# Patient Record
Sex: Female | Born: 1990 | Race: White | Hispanic: No | Marital: Single | Smoking: Never smoker
Health system: Southern US, Community
[De-identification: ages and names within clinical notes are randomized; demographics above are authoritative.]

## PROBLEM LIST (undated history)

## (undated) DIAGNOSIS — I10 Essential (primary) hypertension: Secondary | ICD-10-CM

## (undated) DIAGNOSIS — G43909 Migraine, unspecified, not intractable, without status migrainosus: Secondary | ICD-10-CM

## (undated) DIAGNOSIS — D649 Anemia, unspecified: Secondary | ICD-10-CM

## (undated) DIAGNOSIS — J45909 Unspecified asthma, uncomplicated: Secondary | ICD-10-CM

## (undated) DIAGNOSIS — K219 Gastro-esophageal reflux disease without esophagitis: Secondary | ICD-10-CM

## (undated) DIAGNOSIS — K589 Irritable bowel syndrome without diarrhea: Secondary | ICD-10-CM

## (undated) DIAGNOSIS — R51 Headache: Secondary | ICD-10-CM

## (undated) HISTORY — DX: Gastro-esophageal reflux disease without esophagitis: K21.9

## (undated) HISTORY — DX: Unspecified asthma, uncomplicated: J45.909

## (undated) HISTORY — DX: Essential (primary) hypertension: I10

## (undated) HISTORY — DX: Headache: R51

## (undated) HISTORY — DX: Migraine, unspecified, not intractable, without status migrainosus: G43.909

## (undated) HISTORY — DX: Irritable bowel syndrome without diarrhea: K58.9

## (undated) HISTORY — DX: Anemia, unspecified: D64.9

---
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
## 2015-05-23 ENCOUNTER — Encounter: Payer: Self-pay | Admitting: Physician Assistant

## 2015-05-23 ENCOUNTER — Ambulatory Visit (INDEPENDENT_AMBULATORY_CARE_PROVIDER_SITE_OTHER): Payer: 59 | Admitting: Physician Assistant

## 2015-05-23 VITALS — BP 100/78 | HR 85 | Temp 98.5°F | Resp 16 | Ht 64.5 in | Wt 126.0 lb

## 2015-05-23 DIAGNOSIS — K589 Irritable bowel syndrome without diarrhea: Secondary | ICD-10-CM | POA: Insufficient documentation

## 2015-05-23 DIAGNOSIS — J4520 Mild intermittent asthma, uncomplicated: Secondary | ICD-10-CM

## 2015-05-23 DIAGNOSIS — R300 Dysuria: Secondary | ICD-10-CM

## 2015-05-23 DIAGNOSIS — Z13220 Encounter for screening for lipoid disorders: Secondary | ICD-10-CM

## 2015-05-23 DIAGNOSIS — Z0000 Encounter for general adult medical examination without abnormal findings: Secondary | ICD-10-CM

## 2015-05-23 DIAGNOSIS — J45909 Unspecified asthma, uncomplicated: Secondary | ICD-10-CM | POA: Insufficient documentation

## 2015-05-23 DIAGNOSIS — R51 Headache: Secondary | ICD-10-CM

## 2015-05-23 DIAGNOSIS — G43909 Migraine, unspecified, not intractable, without status migrainosus: Secondary | ICD-10-CM

## 2015-05-23 DIAGNOSIS — Z124 Encounter for screening for malignant neoplasm of cervix: Secondary | ICD-10-CM | POA: Diagnosis not present

## 2015-05-23 DIAGNOSIS — G43119 Migraine with aura, intractable, without status migrainosus: Secondary | ICD-10-CM

## 2015-05-23 DIAGNOSIS — Z136 Encounter for screening for cardiovascular disorders: Secondary | ICD-10-CM

## 2015-05-23 DIAGNOSIS — R519 Headache, unspecified: Secondary | ICD-10-CM | POA: Insufficient documentation

## 2015-05-23 DIAGNOSIS — J3089 Other allergic rhinitis: Secondary | ICD-10-CM

## 2015-05-23 HISTORY — DX: Irritable bowel syndrome, unspecified: K58.9

## 2015-05-23 HISTORY — DX: Headache, unspecified: R51.9

## 2015-05-23 HISTORY — DX: Unspecified asthma, uncomplicated: J45.909

## 2015-05-23 HISTORY — DX: Migraine, unspecified, not intractable, without status migrainosus: G43.909

## 2015-05-23 LAB — POCT URINALYSIS DIPSTICK
BILIRUBIN UA: NEGATIVE
Blood, UA: NEGATIVE
GLUCOSE UA: NEGATIVE
Ketones, UA: NEGATIVE
LEUKOCYTES UA: NEGATIVE
NITRITE UA: NEGATIVE
Protein, UA: NEGATIVE
Spec Grav, UA: 1.01
UROBILINOGEN UA: 0.2
pH, UA: 6.5

## 2015-05-23 MED ORDER — DICYCLOMINE HCL 20 MG PO TABS: 20.0000 mg | ORAL_TABLET | Freq: Three times a day (TID) | ORAL | Status: AC | PRN

## 2015-05-23 MED ORDER — SUMATRIPTAN-NAPROXEN SODIUM 85-500 MG PO TABS: 1.0000 | ORAL_TABLET | ORAL | Status: DC | PRN

## 2015-05-23 MED ORDER — TOPIRAMATE 100 MG PO TABS: 100.0000 mg | ORAL_TABLET | Freq: Two times a day (BID) | ORAL | Status: DC

## 2015-05-23 MED ORDER — MONTELUKAST SODIUM 10 MG PO TABS: 10.0000 mg | ORAL_TABLET | Freq: Every day | ORAL | Status: AC

## 2015-05-23 MED ORDER — FLUTICASONE PROPIONATE 50 MCG/ACT NA SUSP: 2.0000 | Freq: Every day | NASAL | Status: AC

## 2015-05-23 MED ORDER — TIZANIDINE HCL 4 MG PO TABS: 4.0000 mg | ORAL_TABLET | Freq: Once | ORAL | Status: AC

## 2015-05-23 MED ORDER — SUMATRIPTAN SUCCINATE 6 MG/0.5ML ~~LOC~~ SOLN: 6.0000 mg | SUBCUTANEOUS | Status: AC | PRN

## 2015-05-23 NOTE — Progress Notes (Signed)
Patient: Kristen Cervantes, Female    DOB: 07/27/1990, 24 y.o.   MRN: [id number] Visit Date: 05/23/2015  Today's Provider: Margaretann Loveless, PA-C   Chief Complaint  Patient presents with  . Establish Care   Subjective:    Annual physical exam Kristen Cervantes is a 24 y.o. female who presents today to establish Care and for health maintenance and complete physical. She feels well. She reports exercising, runs and lift weights once a week. She reports she is sleeping well. Previous PCP was Dr. Ulice [facility]. Patient had her Influenza Vaccine at [facility]. Patient has concerns about having difficult urination. 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 She states that summer of 2015 she developed a UTI that progressed to pyelonephritis for which she had to go to the emergency room for. She states that since then she has had difficulty urinating and recurrent UTIs. She has never had a Pap smear , mammogram or colonoscopy. -----------------------------------------------------------------   Review of Systems  Constitutional: Negative.   Eyes: Negative.   Respiratory: Negative.   Cardiovascular: Negative.   Gastrointestinal: Negative.   Endocrine: Negative.   Genitourinary: Positive for difficulty urinating.  Musculoskeletal: Positive for neck pain.  Skin: Negative.   Allergic/Immunologic: Positive for environmental allergies.  Neurological: Positive for headaches (migraines).  Hematological: Negative.   Psychiatric/Behavioral: Negative.   All other systems reviewed and are negative.   Social History      She  reports that she has never smoked. She does not have any smokeless tobacco history on file. She reports that she drinks alcohol. She reports that she does not use illicit drugs.       Social History   Social History  . Marital Status: Single    Spouse Name: N/A  . Number of Children: N/A    . Years of Education: N/A   Social History Main Topics  . Smoking status: Never Smoker   . Smokeless tobacco: None  . Alcohol Use: Yes     Comment: 3x a week  . Drug Use: No  . Sexual Activity: Not Asked   Other Topics Concern  . None   Social History Narrative  . None    Patient Active Problem List   Diagnosis Date Noted  . Frequent headaches 05/23/2015  . Asthma 05/23/2015  . IBS (irritable bowel syndrome) 05/23/2015    Past Surgical History  Procedure Laterality Date  . Appendectomy  97  . Robotically assisted laparoscopic ureteral re-implantation  45    Family History        Family Status  Relation Status Death Age  . Mother Alive   . Father Alive         Her family history includes Cancer in her mother.    Allergies  Allergen Reactions  . Other     Cats: Asthma, watery eyes, runny nose    Previous Medications   ALBUTEROL (PROVENTIL IN)    Inhale into the lungs as needed.   CETIRIZINE (ZYRTEC) 10 MG TABLET    Take 10 mg by mouth daily.   DICYCLOMINE (BENTYL) 20 MG TABLET    Take 20 mg by mouth 3 (three) times daily as needed for spasms.   FLUTICASONE (FLONASE) 50 MCG/ACT NASAL SPRAY    Place into both nostrils daily.   MONTELUKAST (SINGULAIR) 10 MG TABLET    Take 10 mg by  mouth daily.   SUMATRIPTAN (IMITREX) 6 MG/0.5ML SOLN INJECTION    Inject 6 mg into the skin as needed for migraine or headache. May repeat in 2 hours if headache persists or recurs.   SUMATRIPTAN-NAPROXEN SODIUM (TREXIMET PO)    Take by mouth as needed.   TIZANIDINE (ZANAFLEX) 4 MG TABLET    Take 4 mg by mouth once.   TOPIRAMATE (TOPAMAX) 100 MG TABLET    Take 100 mg by mouth 2 (two) times daily.    Patient Care Team: Margaretann Loveless, PA-C as PCP - General (Family Medicine)     Objective:   Vitals: BP 100/78 mmHg  Pulse 85  Temp(Src) 98.5 F (36.9 C) (Oral)  Resp 16  Ht 5' 4.5" (1.638 m)  Wt 126 lb (57.153 kg)  BMI 21.30 kg/m2   Physical Exam  Constitutional: She is  oriented to person, place, and time. She appears well-developed and well-nourished. No distress.  HENT:  Head: Normocephalic and atraumatic.  Right Ear: Hearing, tympanic membrane, external ear and ear canal normal.  Left Ear: Hearing, tympanic membrane, external ear and ear canal normal.  Nose: Nose normal.  Mouth/Throat: Uvula is midline, oropharynx is clear and moist and mucous membranes are normal. No oropharyngeal exudate.  Eyes: Conjunctivae and EOM are normal. Pupils are equal, round, and reactive to light. Right eye exhibits no discharge. Left eye exhibits no discharge. No scleral icterus.  Neck: Normal range of motion. Neck supple. No JVD present. Carotid bruit is not present. No tracheal deviation present. No thyromegaly present.  Cardiovascular: Normal rate, regular rhythm, normal heart sounds and intact distal pulses.  Exam reveals no gallop and no friction rub.   No murmur heard. Pulmonary/Chest: Effort normal and breath sounds normal. No respiratory distress. She has no wheezes. She has no rales. She exhibits no tenderness. Right breast exhibits no inverted nipple, no mass, no nipple discharge, no skin change and no tenderness. Left breast exhibits no inverted nipple, no mass, no nipple discharge, no skin change and no tenderness. Breasts are symmetrical.  Abdominal: Soft. Bowel sounds are normal. She exhibits no distension and no mass. There is no tenderness. There is no rebound and no guarding. Hernia confirmed negative in the right inguinal area and confirmed negative in the left inguinal area.  Genitourinary: Rectum normal, vagina normal and uterus normal. No breast swelling, tenderness, discharge or bleeding. Pelvic exam was performed with patient supine. There is no rash, tenderness, lesion or injury on the right labia. There is no rash, tenderness, lesion or injury on the left labia. Cervix exhibits no motion tenderness, no discharge and no friability. Right adnexum displays no  mass, no tenderness and no fullness. Left adnexum displays no mass, no tenderness and no fullness. No erythema, tenderness or bleeding in the vagina. No signs of injury around the vagina. No vaginal discharge found.  Musculoskeletal: Normal range of motion. She exhibits no edema or tenderness.  Lymphadenopathy:    She has no cervical adenopathy.       Right: No inguinal adenopathy present.       Left: No inguinal adenopathy present.  Neurological: She is alert and oriented to person, place, and time. She has normal reflexes. No cranial nerve deficit. Coordination normal.  Skin: Skin is warm and dry. No rash noted. She is not diaphoretic.  Psychiatric: She has a normal mood and affect. Her behavior is normal. Judgment and thought content normal.  Vitals reviewed.    Depression Screen No flowsheet data found.  Assessment & Plan:     Routine Health Maintenance and Physical Exam  1. Annual physical exam Physical exam was normal today. I will check labs as below. I will follow-up with her pending her lab results. If labs are within normal limits and stable she would not need to have them repeated for one year. I will see her back in one year for her repeat annual physical exam. She is to call the office if she has any acute issues, questions or concerns in the meantime. - CBC with Differential/Platelet - Comprehensive metabolic panel - TSH  2. Cervical cancer screening  Pap was collected today. I will follow-up with her pending results of Pap smear. If Pap is within normal limits she would not need to have this repeated for 3 years. - Pap IG, CT/NG w/ reflex HPV when [facility])  3. Encounter for lipid screening for cardiovascular disease She has never had her cholesterol checked. I will check a cholesterol panel as below for screening. I will follow-up with her pending these results. If cholesterol is within normal limits and stable she will not need to have this  rechecked for 5 years. - Lipid panel  4. IBS (irritable bowel syndrome) Currently stable. Continue current medical treatment plan. Medications pulled for refill. - tiZANidine (ZANAFLEX) 4 MG tablet; Take 1 tablet (4 mg total) by mouth once.  Dispense: 30 tablet; Refill: 11 - dicyclomine (BENTYL) 20 MG tablet; Take 1 tablet (20 mg total) by mouth 3 (three) times daily as needed for spasms.  Dispense: 30 tablet; Refill: 11  5. Dysuria UA in the office today was negative for UTI. I will send for culture to make sure that it is not early onset of UTIs and she does have a history of recurrent UTIs and is somewhat symptomatic at this time. Depending on the results of the culture and sensitivities we will treat with appropriate antibiotic if positive. If culture comes back negative we may consider a referral to urology for further evaluation for the difficulty urinating. - POCT urinalysis dipstick - Urine Culture  6. Asthma, mild intermittent, uncomplicated Currently stable. She does have an albuterol inhaler that she uses as needed. She states that she may have to use her albuterol inhaler once every 2-3 months. Continue current medical treatment plan and will follow-up in one year. She is to call the office if she has any acute issues, questions or concerns.  7. Intractable migraine with aura without status migrainosus She states that previously her migraines were controlled well with Topamax. She's has been out of her Topamax for one month. She states that her headaches have become more frequent and that she has been trying to control them that that she can with over-the-counter Excedrin. This has not been working well. She also would take Treximet as needed at onset of migraine. If the Treximet did not work at onset she was to take the Imitrex following. This treatment regimen has been working for her well up until the last month when she ran out of all of her medications and could not get them  refilled. I will refill all medications as below. She is to call the office if she has any worsening symptoms. - topiramate (TOPAMAX) 100 MG tablet; Take 1 tablet (100 mg total) by mouth 2 (two) times daily.  Dispense: 60 tablet; Refill: 11 - SUMAtriptan-naproxen (TREXIMET) 85-500 MG tablet; Take 1 tablet by mouth as needed.  Dispense: 10 tablet; Refill: 6 - SUMAtriptan (IMITREX) 6  MG/0.5ML SOLN injection; Inject 0.5 mLs (6 mg total) into the skin as needed for migraine or headache. May repeat in 2 hours if headache persists or recurs.  Dispense: 10 vial; Refill: 3  8. Environmental and seasonal allergies Currently stable on Zyrtec, Singulair and Flonase. Singulair and Flonase were pulled for refill as below. I did advise her to call the office if her allergy symptoms worsen especially, March when spring allergies become more prevalent. She voiced understanding. She states that she [place] She states that they did talk to her about doing allergy injections at one time but she never went through with them. I did advise for her to let me know if her allergy symptoms become uncontrolled come spring and we can refer her to an ENT for further evaluation of her allergies and consideration for allergy injections. - montelukast (SINGULAIR) 10 MG tablet; Take 1 tablet (10 mg total) by mouth daily.  Dispense: 30 tablet; Refill: 11 - fluticasone (FLONASE) 50 MCG/ACT nasal spray; Place 2 sprays into both nostrils daily.  Dispense: 16 g; Refill: 11   Exercise Activities and Dietary recommendations Goals    None       There is no immunization history on file for this patient.  Health Maintenance  Topic Date Due  . HIV Screening  03/19/2006  . TETANUS/TDAP  03/19/2010  . PAP SMEAR  03/19/2012  . INFLUENZA VACCINE  12/31/2014      Discussed health benefits of physical activity, and encouraged her to engage in regular exercise  appropriate for her age and condition.    --------------------------------------------------------------------

## 2015-05-23 NOTE — Patient Instructions (Signed)
Health Maintenance, Female Adopting a healthy lifestyle and getting preventive care can go a long way to promote health and wellness. Talk with your health care provider about what schedule of regular examinations is right for you. This is a good chance for you to check in with your provider about disease prevention and staying healthy. In between checkups, there are plenty of things you can do on your own. Experts have done a lot of research about which lifestyle changes and preventive measures are most likely to keep you healthy. Ask your health care provider for more information. WEIGHT AND DIET  Eat a healthy diet  Be sure to include plenty of vegetables, fruits, low-fat dairy products, and lean protein.  Do not eat a lot of foods high in solid fats, added sugars, or salt.  Get regular exercise. This is one of the most important things you can do for your health.  Most adults should exercise for at least 150 minutes each week. The exercise should increase your heart rate and make you sweat (moderate-intensity exercise).  Most adults should also do strengthening exercises at least twice a week. This is in addition to the moderate-intensity exercise.  Maintain a healthy weight  Body mass index (BMI) is a measurement that can be used to identify possible weight problems. It estimates body fat based on height and weight. Your health care provider can help determine your BMI and help you achieve or maintain a healthy weight.  For females 28 years of age and older:   A BMI below 18.5 is considered underweight.  A BMI of 18.5 to 24.9 is normal.  A BMI of 25 to 29.9 is considered overweight.  A BMI of 30 and above is considered obese.  Watch levels of cholesterol and blood lipids  You should start having your blood tested for lipids and cholesterol at 24 years of age, then have this test every 5 years.  You may need to have your cholesterol levels checked more often if:  Your lipid  or cholesterol levels are high.  You are older than 24 years of age.  You are at high risk for heart disease.  CANCER SCREENING   Lung Cancer  Lung cancer screening is recommended for adults 75-66 years old who are at high risk for lung cancer because of a history of smoking.  A yearly low-dose CT scan of the lungs is recommended for people who:  Currently smoke.  Have quit within the past 15 years.  Have at least a 30-pack-year history of smoking. A pack year is smoking an average of one pack of cigarettes a day for 1 year.  Yearly screening should continue until it has been 15 years since you quit.  Yearly screening should stop if you develop a health problem that would prevent you from having lung cancer treatment.  Breast Cancer  Practice breast self-awareness. This means understanding how your breasts normally appear and feel.  It also means doing regular breast self-exams. Let your health care provider know about any changes, no matter how small.  If you are in your 20s or 30s, you should have a clinical breast exam (CBE) by a health care provider every 1-3 years as part of a regular health exam.  If you are 25 or older, have a CBE every year. Also consider having a breast X-ray (mammogram) every year.  If you have a family history of breast cancer, talk to your health care provider about genetic screening.  If you  are at high risk for breast cancer, talk to your health care provider about having an MRI and a mammogram every year.  Breast cancer gene (BRCA) assessment is recommended for women who have family members with BRCA-related cancers. BRCA-related cancers include:  Breast.  Ovarian.  Tubal.  Peritoneal cancers.  Results of the assessment will determine the need for genetic counseling and BRCA1 and BRCA2 testing. Cervical Cancer Your health care provider may recommend that you be screened regularly for cancer of the pelvic organs (ovaries, uterus, and  vagina). This screening involves a pelvic examination, including checking for microscopic changes to the surface of your cervix (Pap test). You may be encouraged to have this screening done every 3 years, beginning at age 21.  For women ages 30-65, health care providers may recommend pelvic exams and Pap testing every 3 years, or they may recommend the Pap and pelvic exam, combined with testing for human papilloma virus (HPV), every 5 years. Some types of HPV increase your risk of cervical cancer. Testing for HPV may also be done on women of any age with unclear Pap test results.  Other health care providers may not recommend any screening for nonpregnant women who are considered low risk for pelvic cancer and who do not have symptoms. Ask your health care provider if a screening pelvic exam is right for you.  If you have had past treatment for cervical cancer or a condition that could lead to cancer, you need Pap tests and screening for cancer for at least 20 years after your treatment. If Pap tests have been discontinued, your risk factors (such as having a new sexual partner) need to be reassessed to determine if screening should resume. Some women have medical problems that increase the chance of getting cervical cancer. In these cases, your health care provider may recommend more frequent screening and Pap tests. Colorectal Cancer  This type of cancer can be detected and often prevented.  Routine colorectal cancer screening usually begins at 24 years of age and continues through 24 years of age.  Your health care provider may recommend screening at an earlier age if you have risk factors for colon cancer.  Your health care provider may also recommend using home test kits to check for hidden blood in the stool.  A small camera at the end of a tube can be used to examine your colon directly (sigmoidoscopy or colonoscopy). This is done to check for the earliest forms of colorectal  cancer.  Routine screening usually begins at age 50.  Direct examination of the colon should be repeated every 5-10 years through 24 years of age. However, you may need to be screened more often if early forms of precancerous polyps or small growths are found. Skin Cancer  Check your skin from head to toe regularly.  Tell your health care provider about any new moles or changes in moles, especially if there is a change in a mole's shape or color.  Also tell your health care provider if you have a mole that is larger than the size of a pencil eraser.  Always use sunscreen. Apply sunscreen liberally and repeatedly throughout the day.  Protect yourself by wearing long sleeves, pants, a wide-brimmed hat, and sunglasses whenever you are outside. HEART DISEASE, DIABETES, AND HIGH BLOOD PRESSURE   High blood pressure causes heart disease and increases the risk of stroke. High blood pressure is more likely to develop in:  People who have blood pressure in the high end   of the normal range (130-139/85-89 mm Hg).  People who are overweight or obese.  People who are African American.  If you are 38-23 years of age, have your blood pressure checked every 3-5 years. If you are 61 years of age or older, have your blood pressure checked every year. You should have your blood pressure measured twice--once when you are at a hospital or clinic, and once when you are not at a hospital or clinic. Record the average of the two measurements. To check your blood pressure when you are not at a hospital or clinic, you can use:  An automated blood pressure machine at a pharmacy.  A home blood pressure monitor.  If you are between 45 years and 39 years old, ask your health care provider if you should take aspirin to prevent strokes.  Have regular diabetes screenings. This involves taking a blood sample to check your fasting blood sugar level.  If you are at a normal weight and have a low risk for diabetes,  have this test once every three years after 24 years of age.  If you are overweight and have a high risk for diabetes, consider being tested at a younger age or more often. PREVENTING INFECTION  Hepatitis B  If you have a higher risk for hepatitis B, you should be screened for this virus. You are considered at high risk for hepatitis B if:  You were born in a country where hepatitis B is common. Ask your health care provider which countries are considered high risk.  Your parents were born in a high-risk country, and you have not been immunized against hepatitis B (hepatitis B vaccine).  You have HIV or AIDS.  You use needles to inject street drugs.  You live with someone who has hepatitis B.  You have had sex with someone who has hepatitis B.  You get hemodialysis treatment.  You take certain medicines for conditions, including cancer, organ transplantation, and autoimmune conditions. Hepatitis C  Blood testing is recommended for:  Everyone born from [birth year] through 1965.  Anyone with known risk factors for hepatitis C. Sexually transmitted infections (STIs)  You should be screened for sexually transmitted infections (STIs) including gonorrhea and chlamydia if:  You are sexually active and are younger than 24 years of age.  You are older than 24 years of age and your health care provider tells you that you are at risk for this type of infection.  Your sexual activity has changed since you were last screened and you are at an increased risk for chlamydia or gonorrhea. Ask your health care provider if you are at risk.  If you do not have HIV, but are at risk, it may be recommended that you take a prescription medicine daily to prevent HIV infection. This is called pre-exposure prophylaxis (PrEP). You are considered at risk if:  You are sexually active and do not regularly use condoms or know the HIV status of your partner(s).  You take drugs by injection.  You are sexually  active with a partner who has HIV. Talk with your health care provider about whether you are at high risk of being infected with HIV. If you choose to begin PrEP, you should first be tested for HIV. You should then be tested every 3 months for as long as you are taking PrEP.  PREGNANCY   If you are premenopausal and you may become pregnant, ask your health care provider about preconception counseling.  If you may  become pregnant, take 400 to 800 micrograms (mcg) of folic acid every day.  If you want to prevent pregnancy, talk to your health care provider about birth control (contraception). OSTEOPOROSIS AND MENOPAUSE   Osteoporosis is a disease in which the bones lose minerals and strength with aging. This can result in serious bone fractures. Your risk for osteoporosis can be identified using a bone density scan.  If you are 61 years of age or older, or if you are at risk for osteoporosis and fractures, ask your health care provider if you should be screened.  Ask your health care provider whether you should take a calcium or vitamin D supplement to lower your risk for osteoporosis.  Menopause may have certain physical symptoms and risks.  Hormone replacement therapy may reduce some of these symptoms and risks. Talk to your health care provider about whether hormone replacement therapy is right for you.  HOME CARE INSTRUCTIONS   Schedule regular health, dental, and eye exams.  Stay current with your immunizations.   Do not use any tobacco products including cigarettes, chewing tobacco, or electronic cigarettes.  If you are pregnant, do not drink alcohol.  If you are breastfeeding, limit how much and how often you drink alcohol.  Limit alcohol intake to no more than 1 drink per day for nonpregnant women. One drink equals 12 ounces of beer, 5 ounces of wine, or 1 ounces of hard liquor.  Do not use street drugs.  Do not share needles.  Ask your health care provider for help if  you need support or information about quitting drugs.  Tell your health care provider if you often feel depressed.  Tell your health care provider if you have ever been abused or do not feel safe at home.   This information is not intended to replace advice given to you by your health care provider. Make sure you discuss any questions you have with your health care provider.   Document Released: 12/01/2010 Document Revised: 06/08/2014 Document Reviewed: 04/19/2013 Elsevier Interactive Patient Education [employer].

## 2015-05-24 ENCOUNTER — Telehealth: Payer: Self-pay

## 2015-05-24 DIAGNOSIS — R39198 Other difficulties with micturition: Secondary | ICD-10-CM

## 2015-05-24 DIAGNOSIS — N9489 Other specified conditions associated with female genital organs and menstrual cycle: Secondary | ICD-10-CM

## 2015-05-24 LAB — URINE CULTURE

## 2015-05-24 LAB — PLEASE NOTE

## 2015-05-24 NOTE — Telephone Encounter (Signed)
-----   Message from Margaretann LovelessJennifer M Burnette, [place]PA-C sent at 05/24/2015  4:43 PM EST ----- Urine culture was negative for UTI. Please see if she would like a referral to Urology at this time.

## 2015-05-24 NOTE — Telephone Encounter (Signed)
Pt advised; she would like to proceed with the referral to Urology.   Thanks,   -Vernona RiegerLaura

## 2015-05-25 LAB — CBC WITH DIFFERENTIAL/PLATELET
BASOS: 0 %
Basophils Absolute: 0 10*3/uL (ref 0.0–0.2)
EOS (ABSOLUTE): 0 10*3/uL (ref 0.0–0.4)
EOS: 1 %
HEMATOCRIT: 36.8 % (ref 34.0–46.6)
Hemoglobin: 12 g/dL (ref 11.1–15.9)
IMMATURE GRANULOCYTES: 0 %
Immature Grans (Abs): 0 10*3/uL (ref 0.0–0.1)
LYMPHS ABS: 1.7 10*3/uL (ref 0.7–3.1)
Lymphs: 39 %
MCH: 29.3 pg (ref 26.6–33.0)
MCHC: 32.6 g/dL (ref 31.5–35.7)
MCV: 90 fL (ref 79–97)
MONOS ABS: 0.4 10*3/uL (ref 0.1–0.9)
Monocytes: 8 %
NEUTROS ABS: 2.3 10*3/uL (ref 1.4–7.0)
Neutrophils: 52 %
Platelets: 182 10*3/uL (ref 150–379)
RBC: 4.09 x10E6/uL (ref 3.77–5.28)
RDW: 12.9 % (ref 12.3–15.4)
WBC: 4.5 10*3/uL (ref 3.4–10.8)

## 2015-05-25 LAB — COMPREHENSIVE METABOLIC PANEL
A/G RATIO: 2.2 (ref 1.1–2.5)
ALT: 21 IU/L (ref 0–32)
AST: 17 IU/L (ref 0–40)
Albumin: 4.6 g/dL (ref 3.5–5.5)
Alkaline Phosphatase: 54 IU/L (ref 39–117)
BILIRUBIN TOTAL: 0.3 mg/dL (ref 0.0–1.2)
BUN/Creatinine Ratio: 13 (ref 8–20)
BUN: 10 mg/dL (ref 6–20)
CALCIUM: 9.3 mg/dL (ref 8.7–10.2)
CO2: 23 mmol/L (ref 18–29)
Chloride: 100 mmol/L (ref 96–106)
Creatinine, Ser: 0.76 mg/dL (ref 0.57–1.00)
GFR calc Af Amer: 127 mL/min/{1.73_m2} (ref >59)
GFR, EST NON AFRICAN AMERICAN: 110 mL/min/{1.73_m2} (ref >59)
GLOBULIN, TOTAL: 2.1 g/dL (ref 1.5–4.5)
Glucose: 92 mg/dL (ref 65–99)
POTASSIUM: 4.5 mmol/L (ref 3.5–5.2)
SODIUM: 140 mmol/L (ref 134–144)
Total Protein: 6.7 g/dL (ref 6.0–8.5)

## 2015-05-25 LAB — LIPID PANEL
CHOL/HDL RATIO: 2.4 ratio (ref 0.0–4.4)
Cholesterol, Total: 111 mg/dL (ref 100–199)
HDL: 47 mg/dL (ref >39)
LDL Calculated: 57 mg/dL (ref 0–99)
Triglycerides: 34 mg/dL (ref 0–149)
VLDL Cholesterol Cal: 7 mg/dL (ref 5–40)

## 2015-05-25 LAB — TSH: TSH: 2.84 u[IU]/mL (ref 0.450–4.500)

## 2015-05-28 ENCOUNTER — Telehealth: Payer: Self-pay

## 2015-05-28 NOTE — Telephone Encounter (Signed)
Patient advised as directed below.  Thanks,  -Mateya Torti 

## 2015-05-28 NOTE — Telephone Encounter (Signed)
-----   Message from Margaretann LovelessJennifer M Burnette, [place] JerseyPA-C sent at 05/28/2015  8:26 AM EST ----- All labs are within normal limits and stable.  Still awaiting pap results. Thanks! -JB

## 2015-05-29 LAB — PAP IG, CT-NG, RFX HPV ASCU
CHLAMYDIA, NUC. ACID AMP: NEGATIVE
Gonococcus by Nucleic Acid Amp: NEGATIVE
PAP SMEAR COMMENT: 0

## 2015-06-12 ENCOUNTER — Telehealth: Payer: Self-pay | Admitting: Obstetrics and Gynecology

## 2015-06-12 ENCOUNTER — Encounter: Payer: Self-pay | Admitting: Obstetrics and Gynecology

## 2015-06-12 ENCOUNTER — Ambulatory Visit (INDEPENDENT_AMBULATORY_CARE_PROVIDER_SITE_OTHER): Payer: 59 | Admitting: Obstetrics and Gynecology

## 2015-06-12 VITALS — BP 117/78 | HR 88 | Ht 64.5 in | Wt 126.8 lb

## 2015-06-12 DIAGNOSIS — N390 Urinary tract infection, site not specified: Secondary | ICD-10-CM | POA: Diagnosis not present

## 2015-06-12 DIAGNOSIS — R39198 Other difficulties with micturition: Secondary | ICD-10-CM | POA: Diagnosis not present

## 2015-06-12 DIAGNOSIS — R350 Frequency of micturition: Secondary | ICD-10-CM | POA: Diagnosis not present

## 2015-06-12 DIAGNOSIS — N8184 Pelvic muscle wasting: Secondary | ICD-10-CM | POA: Diagnosis not present

## 2015-06-12 DIAGNOSIS — M6289 Other specified disorders of muscle: Secondary | ICD-10-CM

## 2015-06-12 LAB — MICROSCOPIC EXAMINATION
Bacteria, UA: NONE SEEN
Epithelial Cells (non renal): NONE SEEN /hpf (ref 0–10)

## 2015-06-12 LAB — URINALYSIS, COMPLETE
Bilirubin, UA: NEGATIVE
GLUCOSE, UA: NEGATIVE
Ketones, UA: NEGATIVE
Nitrite, UA: NEGATIVE
PROTEIN UA: NEGATIVE
SPEC GRAV UA: 1.01 (ref 1.005–1.030)
Urobilinogen, Ur: 0.2 mg/dL (ref 0.2–1.0)
pH, UA: 7 (ref 5.0–7.5)

## 2015-06-12 LAB — BLADDER SCAN AMB NON-IMAGING: Scan Result: 31

## 2015-06-12 NOTE — Telephone Encounter (Signed)
Please notify patient that I would also like for her to have a KUB done when she goes to get her RUS performed.  I would like to make sure she does not have kidney stones.  I have placed the order.  thanks

## 2015-06-12 NOTE — Progress Notes (Signed)
06/12/2015 3:24 PM   Lewie Chamber [birth year] [id number]  Referring provider: Margaretann Loveless, PA-C [address] [place], [place] [address]  Chief Complaint  Patient presents with  . Urinary Retention  . Establish Care    HPI: Patient is a 25 year old female presenting today as a referral from her primary care provider with complaints of recurrent UTIs, urinary frequency, hesitancy and intermittent stream. Daytime frequency every 1-2 hours.  Reports she's experienced many UTIs since she had a kidney infection 1 year ago. No cultures are available but per patient they were positive for infection. She denies gross hematuria, flank pain, fevers or dysuria.   History of ureteral reimplantation as an infant. 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  H20 intake 2-3 bottles per day.    Normal pelvic exam last week by PCP.   No history of kidney stones.  Mother has OAB.    PMH: Past Medical History  Diagnosis Date  . Anemia   . GERD (gastroesophageal reflux disease)   . High blood pressure     Surgical History: Past Surgical History  Procedure Laterality Date  . Appendectomy  97  . Robotically assisted laparoscopic ureteral re-implantation  93    Home Medications:    Medication List       This list is accurate as of: 06/12/15  3:24 PM.  Always use your most recent med list.               cetirizine 10 MG tablet  Commonly known as:  ZYRTEC  Take 10 mg by mouth daily.     dicyclomine 20 MG tablet  Commonly known as:  BENTYL  Take 1 tablet (20 mg total) by mouth 3 (three) times daily as needed for spasms.     fluticasone 50 MCG/ACT nasal spray  Commonly known as:  FLONASE  Place 2 sprays into both nostrils daily.     montelukast 10 MG tablet  Commonly known as:  SINGULAIR  Take 1 tablet (10 mg total) by mouth daily.     PROVENTIL IN  Inhale into the lungs as needed.     SUMAtriptan 6 MG/0.5ML Soln injection  Commonly known as:   IMITREX  Inject 0.5 mLs (6 mg total) into the skin as needed for migraine or headache. May repeat in 2 hours if headache persists or recurs.     SUMAtriptan-naproxen 85-500 MG tablet  Commonly known as:  TREXIMET  Take 1 tablet by mouth as needed.     tiZANidine 4 MG tablet  Commonly known as:  ZANAFLEX  Take 1 tablet (4 mg total) by mouth once.     topiramate 100 MG tablet  Commonly known as:  TOPAMAX  Take 1 tablet (100 mg total) by mouth 2 (two) times daily.        Allergies:  Allergies  Allergen Reactions  . Other     Cats: Asthma, watery eyes, runny nose    Family History: Family History  Problem Relation Age of Onset  . Cancer Mother     Social History:  reports that she has never smoked. She does not have any smokeless tobacco history on file. She reports that she drinks alcohol. She reports that she does not use illicit drugs.  ROS: UROLOGY Frequent Urination?: Yes Hard to postpone urination?: No Burning/pain with urination?: No Get up at night to urinate?: No Leakage of urine?: No Urine stream starts and stops?: No Trouble starting stream?: Yes Do you have  to strain to urinate?: No Blood in urine?: No Urinary tract infection?: No Sexually transmitted disease?: No Injury to kidneys or bladder?: Yes Painful intercourse?: No Weak stream?: No Currently pregnant?: No Vaginal bleeding?: No Last menstrual period?: 06/10/15  Gastrointestinal Nausea?: No Vomiting?: No Indigestion/heartburn?: No Diarrhea?: No Constipation?: No  Constitutional Fever: No Night sweats?: No Weight loss?: No Fatigue?: Yes  Skin Skin rash/lesions?: No Itching?: No  Eyes Blurred vision?: No Double vision?: No  Ears/Nose/Throat Sore throat?: Yes Sinus problems?: Yes  Hematologic/Lymphatic Swollen glands?: No Easy bruising?: No  Cardiovascular Leg swelling?: No Chest pain?: No  Respiratory Cough?: Yes Shortness of breath?: No  Endocrine Excessive thirst?:  No  Musculoskeletal Back pain?: No Joint pain?: No  Neurological Headaches?: Yes Dizziness?: No  Psychologic Depression?: No Anxiety?: No  Physical Exam: BP 117/78 mmHg  Pulse 88  Ht 5' 4.5" (1.638 m)  Wt 126 lb 12.8 oz (57.516 kg)  BMI 21.44 kg/m2  Constitutional:  Alert and oriented, No acute distress. HEENT: [place] AT, moist mucus membranes.  Trachea midline, no masses. Cardiovascular: No clubbing, cyanosis, or edema. Respiratory: Normal respiratory effort, no increased work of breathing. GI: Abdomen is soft, nontender, nondistended, no abdominal masses GU: No CVA tenderness. Skin: No rashes, bruises or suspicious lesions. Neurologic: Grossly intact, no focal deficits, moving all 4 extremities. Psychiatric: Normal mood and affect.  Laboratory Data:   Urinalysis    Component Value Date/Time   BILIRUBINUR negative 05/23/2015 1054   PROTEINUR negative 05/23/2015 1054   UROBILINOGEN 0.2 05/23/2015 1054   NITRITE negative 05/23/2015 1054   LEUKOCYTESUR Negative 05/23/2015 1054    Pertinent Imaging:   Assessment & Plan:    1.  Urinary frequency- Provided samples of uribel today. We will reevaluate her symptoms when she returns to review her renal ultrasound results. She now a KUB in the future to evaluate for possible constipation. She does take Bentyl for IBS.  2.  Recurrent UTI- History of ureteral reflux as an infant s/p ureteral reimplantation. Renal ultrasound ordered for further evaluation.  -RUS UTI prevention strategies discussed.  Good perineal hygiene reviewed. Patient is encouraged to increase daily water intake, start cranberry supplements to prevent invasive colonization along the urinary tract and probiotics, especially lactobacillus to restore normal vaginal flora.  3. Pelvic floor dysfunction- Referral placed for PT consultation. -Referral Physical therapy   There are no diagnoses linked to this encounter.  Return for RUS results.  These notes  generated with voice recognition software. I apologize for typographical errors.  Earlie LouLindsay Promyse Ardito, FNP  [facility] [address], [address] [place], [place] [address] [phone]

## 2015-06-12 NOTE — Patient Instructions (Signed)

## 2015-06-12 NOTE — Telephone Encounter (Signed)
LMOM-needs x-ray at the time of RUS.

## 2015-06-21 ENCOUNTER — Ambulatory Visit
Admission: RE | Admit: 2015-06-21 | Discharge: 2015-06-21 | Disposition: A | Discharge status: Home / Self Care | Payer: 59 | Source: Ambulatory Visit | Attending: Obstetrics and Gynecology | Admitting: Obstetrics and Gynecology

## 2015-06-21 DIAGNOSIS — R350 Frequency of micturition: Secondary | ICD-10-CM

## 2015-06-21 DIAGNOSIS — N390 Urinary tract infection, site not specified: Secondary | ICD-10-CM | POA: Insufficient documentation

## 2015-06-21 DIAGNOSIS — R39198 Other difficulties with micturition: Secondary | ICD-10-CM

## 2015-06-26 ENCOUNTER — Encounter: Payer: Self-pay | Admitting: Obstetrics and Gynecology

## 2015-06-26 ENCOUNTER — Ambulatory Visit (INDEPENDENT_AMBULATORY_CARE_PROVIDER_SITE_OTHER): Payer: 59 | Admitting: Obstetrics and Gynecology

## 2015-06-26 VITALS — BP 107/74 | HR 98 | Resp 16 | Ht 64.5 in | Wt 124.3 lb

## 2015-06-26 DIAGNOSIS — R339 Retention of urine, unspecified: Secondary | ICD-10-CM | POA: Diagnosis not present

## 2015-06-26 DIAGNOSIS — N390 Urinary tract infection, site not specified: Secondary | ICD-10-CM

## 2015-06-26 DIAGNOSIS — R350 Frequency of micturition: Secondary | ICD-10-CM | POA: Diagnosis not present

## 2015-06-26 DIAGNOSIS — N8184 Pelvic muscle wasting: Secondary | ICD-10-CM | POA: Diagnosis not present

## 2015-06-26 DIAGNOSIS — M6289 Other specified disorders of muscle: Secondary | ICD-10-CM

## 2015-06-26 LAB — URINALYSIS, COMPLETE
BILIRUBIN UA: NEGATIVE
Glucose, UA: NEGATIVE
KETONES UA: NEGATIVE
LEUKOCYTES UA: NEGATIVE
Nitrite, UA: NEGATIVE
PROTEIN UA: NEGATIVE
RBC UA: NEGATIVE
SPEC GRAV UA: 1.015 (ref 1.005–1.030)
Urobilinogen, Ur: 0.2 mg/dL (ref 0.2–1.0)
pH, UA: 7 (ref 5.0–7.5)

## 2015-06-26 LAB — MICROSCOPIC EXAMINATION
RBC, UA: NONE SEEN /hpf (ref 0–?)
WBC, UA: NONE SEEN /hpf (ref 0–?)

## 2015-06-26 NOTE — Progress Notes (Signed)
3:49 PM   Kristen Cervantes 11/14/1990 [id number]  Referring provider: Margaretann Loveless, PA-C [address] [place], [place] [address]  Chief Complaint  Patient presents with  . Results  . Urinary Frequency    HPI: Patient is a 25 year old female presenting today as a referral from her primary care provider with complaints of recurrent UTIs, urinary frequency, hesitancy and intermittent stream. Daytime frequency every 1-2 hours.  Reports she's experienced many UTIs since she had a kidney infection 1 year ago. No cultures are available but per patient they were positive for infection. She denies gross hematuria, flank pain, fevers or dysuria.   History of ureteral reimplantation as an infant. 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  H20 intake 2-3 bottles per day.    Normal pelvic exam last week by PCP.   No history of kidney stones.  Mother has OAB.    PMH: Past Medical History  Diagnosis Date  . Anemia   . GERD (gastroesophageal reflux disease)   . High blood pressure     Surgical History: Past Surgical History  Procedure Laterality Date  . Appendectomy  97  . Robotically assisted laparoscopic ureteral re-implantation  93    Home Medications:    Medication List       This list is accurate as of: 06/26/15  3:49 PM.  Always use your most recent med list.               cetirizine 10 MG tablet  Commonly known as:  ZYRTEC  Take 10 mg by mouth daily.     dicyclomine 20 MG tablet  Commonly known as:  BENTYL  Take 1 tablet (20 mg total) by mouth 3 (three) times daily as needed for spasms.     fluticasone 50 MCG/ACT nasal spray  Commonly known as:  FLONASE  Place 2 sprays into both nostrils daily.     montelukast 10 MG tablet  Commonly known as:  SINGULAIR  Take 1 tablet (10 mg total) by mouth daily.     PROVENTIL IN  Inhale into the lungs as needed.     SUMAtriptan 6 MG/0.5ML Soln injection  Commonly known as:  IMITREX  Inject 0.5  mLs (6 mg total) into the skin as needed for migraine or headache. May repeat in 2 hours if headache persists or recurs.     SUMAtriptan-naproxen 85-500 MG tablet  Commonly known as:  TREXIMET  Take 1 tablet by mouth as needed.     tiZANidine 4 MG tablet  Commonly known as:  ZANAFLEX  Take 1 tablet (4 mg total) by mouth once.     topiramate 100 MG tablet  Commonly known as:  TOPAMAX  Take 1 tablet (100 mg total) by mouth 2 (two) times daily.        Allergies:  Allergies  Allergen Reactions  . Other     Cats: Asthma, watery eyes, runny nose    Family History: Family History  Problem Relation Age of Onset  . Cancer Mother     Social History:  reports that she has never smoked. She does not have any smokeless tobacco history on file. She reports that she drinks alcohol. She reports that she does not use illicit drugs.  ROS: UROLOGY Frequent Urination?: Yes Hard to postpone urination?: No Burning/pain with urination?: No Get up at night to urinate?: No Leakage of urine?: No Urine stream starts and stops?: Yes Trouble starting stream?: Yes Do you have to strain  to urinate?: Yes Blood in urine?: No Urinary tract infection?: No Sexually transmitted disease?: No Injury to kidneys or bladder?: No Painful intercourse?: No Weak stream?: No Currently pregnant?: No Vaginal bleeding?: No Last menstrual period?: 06/10/15  Gastrointestinal Nausea?: No Vomiting?: No Indigestion/heartburn?: No Diarrhea?: No Constipation?: No  Constitutional Fever: No Night sweats?: No Weight loss?: No Fatigue?: No  Skin Skin rash/lesions?: No Itching?: No  Eyes Blurred vision?: No Double vision?: No  Ears/Nose/Throat Sore throat?: No Sinus problems?: No  Hematologic/Lymphatic Swollen glands?: No Easy bruising?: No  Cardiovascular Leg swelling?: No Chest pain?: No  Respiratory Cough?: No Shortness of breath?: No  Endocrine Excessive thirst?:  No  Musculoskeletal Back pain?: No Joint pain?: No  Neurological Headaches?: No Dizziness?: No  Psychologic Depression?: No Anxiety?: No  Physical Exam: BP 107/74 mmHg  Pulse 98  Resp 16  Ht 5' 4.5" (1.638 m)  Wt 124 lb 4.8 oz (56.382 kg)  BMI 21.01 kg/m2  LMP 06/14/2015  Constitutional:  Alert and oriented, No acute distress. HEENT: [place] AT, moist mucus membranes.  Trachea midline, no masses. Cardiovascular: No clubbing, cyanosis, or edema. Respiratory: Normal respiratory effort, no increased work of breathing. GI: Abdomen is soft, nontender, nondistended, no abdominal masses GU: No CVA tenderness. Skin: No rashes, bruises or suspicious lesions. Neurologic: Grossly intact, no focal deficits, moving all 4 extremities. Psychiatric: Normal mood and affect.  Laboratory Data:   Urinalysis Results for orders placed or performed in visit on 06/12/15  Microscopic Examination  Result Value Ref Range   WBC, UA 0-5 0 -  5 /hpf   RBC, UA 0-2 0 -  2 /hpf   Epithelial Cells (non renal) None seen 0 - 10 /hpf   Mucus, UA Present (A) Not Estab.   Bacteria, UA None seen None seen/Few  Urinalysis, Complete  Result Value Ref Range   Specific Gravity, UA 1.010 1.005 - 1.030   pH, UA 7.0 5.0 - 7.5   Color, UA Yellow Yellow   Appearance Ur Clear Clear   Leukocytes, UA Trace (A) Negative   Protein, UA Negative Negative/Trace   Glucose, UA Negative Negative   Ketones, UA Negative Negative   RBC, UA 1+ (A) Negative   Bilirubin, UA Negative Negative   Urobilinogen, Ur 0.2 0.2 - 1.0 mg/dL   Nitrite, UA Negative Negative   Microscopic Examination See below:   BLADDER SCAN AMB NON-IMAGING  Result Value Ref Range   Scan Result 31 mL      Pertinent Imaging: CLINICAL DATA: Recurrent UTI, urinary frequency  EXAM: RENAL / URINARY TRACT ULTRASOUND COMPLETE  COMPARISON: None.  FINDINGS: Right Kidney:  Length: 11 cm. Echogenicity within normal limits. No mass  or hydronephrosis visualized.  Left Kidney:  Length: 12 cm. Echogenicity within normal limits. No mass or hydronephrosis visualized. Mild left pelviectasis.  Bladder:  Appears normal for degree of bladder distention. Prevoid volume 100 mL. Postvoid volume 0 mL.  IMPRESSION: Normal renal ultrasound.   Electronically Signed  By: Elige Ko  On: 06/21/2015 16:55  CLINICAL DATA: Difficulty urinating  EXAM: ABDOMEN - 1 VIEW  COMPARISON: None.  FINDINGS: No disproportionate dilatation of bowel. No obvious free intraperitoneal gas. Mild scoliosis of the lumbar spine.  IMPRESSION: Nonobstructive bowel gas pattern. Electronically Signed  By: Jolaine Click M.D.  On: 06/21/2015 17:07  Assessment & Plan:    1.  Urinary frequency- No improvement with uribel. Patient reports that symptoms are unchanged from previous visit. KUB demonstrating no stones or evidence of constipation.  2.  Recurrent UTI- History of ureteral reflux as an infant s/p ureteral reimplantation. 06/21/15 RUS unremarkable.   3. Pelvic floor dysfunction- Referral placed for PT consultation last visit.  Patient has appointment tomorrow.  There are no diagnoses linked to this encounter.  Return for UDS results with MD.  These notes generated with voice recognition software. I apologize for typographical errors.  Earlie Lou, FNP  [facility] [address], [address] [place], [place] [address] [phone]

## 2015-06-27 ENCOUNTER — Ambulatory Visit: Payer: 59 | Attending: Obstetrics and Gynecology | Admitting: Physical Therapy

## 2015-06-27 DIAGNOSIS — R279 Unspecified lack of coordination: Secondary | ICD-10-CM | POA: Diagnosis not present

## 2015-06-27 DIAGNOSIS — R293 Abnormal posture: Secondary | ICD-10-CM | POA: Insufficient documentation

## 2015-06-27 NOTE — Patient Instructions (Signed)
No sit ups   Frog stretch 15x w/ exhale  childs poses w/ pillows 5 breaths  <-> childs pose rocking 5 x                                                   Preserve the function of your pelvic floor, abdomen, and back.              Avoid decreased straining of abdominal/pelvic floor muscles with less              slouching,  holding your breath with lifting/bowel movements)                                                     FUNCTIONAL POSTURES

## 2015-06-28 NOTE — Therapy (Signed)
[facility] MAIN [facility] SERVICES [address] [place], [place], [address] Phone: [phone]   Fax:  [phone]  Physical Therapy Evaluation  Patient Details  Name: Kristen Cervantes MRN: [id number] Date of Birth: 07-31-1990 Referring Provider: Dreama Saa   Encounter Date: 06/27/2015      PT End of Session - 06/28/15 1252    Visit Number 1   Number of Visits 12   Date for PT Re-Evaluation 09/12/15   PT Start Time 1300   PT Stop Time 1405   PT Time Calculation (min) 65 min   Activity Tolerance Patient tolerated treatment well;No increased pain   Behavior During Therapy [facility] for tasks assessed/performed      Past Medical History  Diagnosis Date  . Anemia   . GERD (gastroesophageal reflux disease)   . High blood pressure     Past Surgical History  Procedure Laterality Date  . Appendectomy  97  . Robotically assisted laparoscopic ureteral re-implantation  93    There were no vitals filed for this visit.  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  Pt finds that she has to urinate every 15 min in the evening, every 1 hr in teh morning. Pt feels  "she does not get it all out". She notices intermittent urine stream. Pt has to sit for 30 min before she feels completely emptied. 2)  Poor sleep: getting to sleep (not working days)  and staying asleep (working days) . Pt feels " she is never fully awake". Working third shift is difficult.     3) scoliosis related pain:  5/10 mid to lower area with  shoulder/ neck 7/10    Pertinent History Pt reports she has had IBS when she was 25 yo. Constipation has been a problem for most of  her life.  Pt reports  daily bowel movements now but with  Type 2 on Bristol Stool Scale. Pt has increased her fiber. Hx : Appendectomy. Scoliosis (have worn a brace brace as a teen).  1 x week fitness routine: elliptical, crunches 25x , medicine ball    Patient Stated Goals get her body working better     Currently in Pain? Yes   Pain Score --   Pain Location --            [facility] PT Assessment - 06/28/15 1226    Assessment   Medical Diagnosis difficulty w/ urination and urinary frequency   Referring Provider [facility]    Precautions   Precautions None   Restrictions   Weight Bearing Restrictions No   Balance Screen   Has the patient fallen in the past 6 months No   Observation/Other Assessments   Observations slight L lumbar scoliosis    Coordination   Gross Motor Movements are Fluid and Coordinated --  abdominal straining w/ cue for bowel movement   Fine Motor Movements are Fluid and Coordinated --  limited pelvic floor relaxation   AROM   Overall AROM Comments L rotation 50% compared to R , pain with ext on low back    PROM  Overall PROM Comments hip IR supine ~5 deg bilaterally pre-Tx, ~10 deg post-Tx   Palpation   SI assessment  symmetry noted, tenderness at PSIS , lateral edges , no tenderness with cue for exhalation, deep core engagement    Palpation comment --                 Pelvic Floor Special Questions - 06/28/15 1252    Diastasis Recti neg   External Perineal Exam through clothing                  PT Education - 06/28/15 1252    Education provided Yes   Education Details POC, HEP, goals, anatomy and physiology   Person(s) Educated Patient   Methods Explanation;Demonstration;Tactile cues;Handout;Verbal cues   Comprehension Verbalized understanding;Returned demonstration             PT Long Term Goals - 06/27/15 1423    PT LONG TERM GOAL #1   Title Pt will report decreased time from 30 min to < 15 min to completely urinate before  bed.    Time 12   Period Weeks   Status New   PT LONG TERM GOAL #2   Title Pt will report frequency of urination during the day at every 2 hrs and at night every 1 hr before bed  in order to sleep and to work.    Time 12   Period Weeks   Status New   PT LONG TERM GOAL #3   Title Pt will report decreased LBP from 6-7/10 to < 2/10 in order to improve QOL.    Time 12   Period Weeks   Status New   PT LONG TERM GOAL #4   Title Pt will report ability to insert tampons with less difficulty from 70% to < 50% to promote hygiene and tolerate gyn exams.    Time 12   Period Weeks   Status New               Plan - 06/28/15 1253    Clinical Impression Statement Pt is a 25 yo female who c/o urinary frequency and difficulty w/ urination, CLBP 2/2 scoliosis, and poor sleep  quality. These factors impact her QOL. Pt's clinical presentations include  hyperactive pelvic floor, poor coordination of deep core mm and strength,  abdominal scar, poor body mechanics that places strain on her pelvic floor  muscles, poor stress management skills,  spinal deviations, and  limited  spinal mobility. Pt's personal factors include scoliosis, IBS, and working  night shift.  With these factors combined, pt 's conditions are stable and  moderate  in complexity. Pt tolerated manual Tx today without complaints.  PT demonstrated decreased mm tensions and decreased  tenderness to  palpation on pelvic floor muscles with increased hip IR PROM. Plan to  initiate intravaginal  assessment at next session.      Pt will benefit from skilled therapeutic intervention in order to improve on the following deficits Decreased activity tolerance;Postural dysfunction;Increased muscle spasms;Improper body mechanics;Decreased strength;Pain;Hypomobility;Decreased coordination;Decreased endurance;Increased fascial restricitons   PT Frequency 1x / week   PT Duration 12 weeks   PT Treatment/Interventions ADLs/Self Care Home  Management;Aquatic Therapy;Electrical Stimulation;Moist Heat;Traction;Ultrasound;Patient/family education;Neuromuscular re-education;Therapeutic exercise;Therapeutic activities;Functional mobility training;Stair training;Manual techniques;Scar mobilization;Energy conservation;Taping  relaxation training, pain science education         Problem List Patient Active Problem List   Diagnosis Date Noted  . Frequent headaches 05/23/2015  . IBS (irritable bowel syndrome) 05/23/2015  .  Asthma 05/23/2015  . Migraines 05/23/2015    Mariane Masters ,PT, DPT, E-RYT  06/28/2015, 1:03 PM  [facility] MAIN [facility] SERVICES [address] [place], [place], [address] Phone: [phone]   Fax:  [phone]  Name: Kristen Cervantes MRN: [id number] Date of Birth: May 26, 1991

## 2015-07-04 ENCOUNTER — Ambulatory Visit: Payer: 59 | Attending: Obstetrics and Gynecology | Admitting: Physical Therapy

## 2015-07-04 DIAGNOSIS — R293 Abnormal posture: Secondary | ICD-10-CM | POA: Diagnosis not present

## 2015-07-04 DIAGNOSIS — R279 Unspecified lack of coordination: Secondary | ICD-10-CM | POA: Diagnosis not present

## 2015-07-04 NOTE — Patient Instructions (Addendum)
Emailed body scan with supporting research. To practice prior to bed.    Restorative yoga (legs propped over couch), use of blankets, rice bags over pelvis, scarf to quiet nervous system  (handout)  To practice after work to decompress.

## 2015-07-04 NOTE — Therapy (Signed)
[facility] MAIN [facility] SERVICES [address] [place], [place], [address] Phone: [phone]   Fax:  [phone]  Physical Therapy Treatment  Patient Details  Name: Kristen Cervantes MRN: [id number] Date of Birth: 09-19-1990 Referring Provider: Dreama Saa   Encounter Date: 07/04/2015      PT End of Session - 07/04/15 1133    Visit Number 2   Number of Visits 12   Date for PT Re-Evaluation 09/12/15   PT Start Time 0910   PT Stop Time 1000   PT Time Calculation (min) 50 min   Activity Tolerance Patient tolerated treatment well;No increased pain   Behavior During Therapy [facility] for tasks assessed/performed      Past Medical History  Diagnosis Date  . Anemia   . GERD (gastroesophageal reflux disease)   . High blood pressure     Past Surgical History  Procedure Laterality Date  . Appendectomy  97  . Robotically assisted laparoscopic ureteral re-implantation  93    There were no vitals filed for this visit.  Visit Diagnosis:  Posture abnormality  Lack of coordination      Subjective Assessment - 07/04/15 0912    Subjective Pt reported that elevating her feet on a stool has felt " more natural" when having bowel movements.  Pt states she has not been sleeping well due to stressful dreams. 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              [facility] PT Assessment - 07/04/15 1130    Observation/Other Assessments   Observations rounded shoulders upon arrival,  more upright posture end of session   Palpation   Palpation comment non-soft  over abdomen, (decreased tension post Tx)                      [facility] Adult PT Treatment/Exercise - 07/04/15 1129    Neuro Re-ed    Neuro Re-ed Details  guided pt through body scan, educated about principles of restroative yoga, positioned pt and used props to faciliate relaxation   Moist Heat Therapy   Number Minutes Moist Heat 10 Minutes   Moist Heat  Location Other (comment)  abdomen/ skin intact postTx   Manual Therapy   Manual therapy comments abdominal belly massage, fascial release over scar                PT Education - 07/04/15 1133    Education provided Yes   Education Details HEP   Person(s) Educated Patient   Methods Explanation;Demonstration;Tactile cues;Verbal cues;Handout   Comprehension Verbalized understanding;Returned demonstration             PT Long Term Goals - 07/04/15 1136    PT LONG TERM GOAL #1   Title Pt will report decreased time from 30 min to < 15 min to completely urinate before bed.    Time 12   Period Weeks   Status New   PT LONG TERM GOAL #2   Title Pt will report frequency of urination during the day at every 2 hrs and at night every 1 hr before bed  in order to sleep and to work.    Time 12   Period Weeks   Status New   PT LONG TERM GOAL #3   Title Pt will report decreased LBP from 6-7/10 to < 2/10 in order to improve QOL.    Time 12   Period Weeks  Status New   PT LONG TERM GOAL #4   Title Pt will report ability to insert tampons with less difficulty from 70% to < 50% to promote hygiene and tolerate gyn exams.    Time 12   Period Weeks   Status New   PT LONG TERM GOAL #5   Title Pt will be IND and compliant with relaxation techniques in order to down train her nervous system independently for better pelvic floor and GI function. .    Time 12   Period Weeks   Status New               Plan - 07/04/15 1133    Clinical Impression Statement Pt reported feeling "serene" and "more relaxed" after relaxation training and manual Tx. Addressed relaxation education to promote sleep quality , self-care, and stress management for improved bowel, urinary, GI, and pelvic floor function.   Anticipate pt will progress towards her goals with further PT.    Pt will benefit from skilled therapeutic intervention in order to improve on the following deficits Decreased activity  tolerance;Postural dysfunction;Increased muscle spasms;Improper body mechanics;Decreased strength;Pain;Hypomobility;Decreased coordination;Decreased endurance;Increased fascial restricitons   PT Frequency 1x / week   PT Duration 12 weeks   PT Treatment/Interventions ADLs/Self Care Home Management;Aquatic Therapy;Electrical Stimulation;Moist Heat;Traction;Ultrasound;Patient/family education;Neuromuscular re-education;Therapeutic exercise;Therapeutic activities;Functional mobility training;Stair training;Manual techniques;Scar mobilization;Energy conservation;Taping  relaxation training, pain science education   Consulted and Agree with Plan of Care Patient        Problem List Patient Active Problem List   Diagnosis Date Noted  . Frequent headaches 05/23/2015  . IBS (irritable bowel syndrome) 05/23/2015  . Asthma 05/23/2015  . Migraines 05/23/2015    Mariane Masters ,PT, DPT, E-RYT  07/04/2015, 11:41 AM  [facility] [facility] MAIN [facility] SERVICES [address] [place], [place], [address] Phone: [phone]   Fax:  [phone]  Name: Kristen Cervantes MRN: [id number] Date of Birth: 15-Feb-1991

## 2015-07-11 DIAGNOSIS — Z0000 Encounter for general adult medical examination without abnormal findings: Secondary | ICD-10-CM | POA: Diagnosis not present

## 2015-07-11 DIAGNOSIS — R350 Frequency of micturition: Secondary | ICD-10-CM | POA: Diagnosis not present

## 2015-07-11 DIAGNOSIS — R39198 Other difficulties with micturition: Secondary | ICD-10-CM | POA: Diagnosis not present

## 2015-07-15 ENCOUNTER — Ambulatory Visit: Payer: 59 | Admitting: Physical Therapy

## 2015-07-22 ENCOUNTER — Ambulatory Visit: Payer: 59 | Admitting: Physical Therapy

## 2015-07-22 DIAGNOSIS — R279 Unspecified lack of coordination: Secondary | ICD-10-CM

## 2015-07-22 DIAGNOSIS — R293 Abnormal posture: Secondary | ICD-10-CM | POA: Diagnosis not present

## 2015-07-22 NOTE — Patient Instructions (Signed)
Pre abdominal massage ROM (handout)  Abdominal massage (handout)

## 2015-07-22 NOTE — Therapy (Signed)
[facility] MAIN [facility] SERVICES [address] [place], [place], [address] Phone: [phone]   Fax:  [phone]  Physical Therapy Treatment  Patient Details  Name: Kristen Cervantes MRN: [id number] Date of Birth: [birth year] Referring Provider: Dreama Saa   Encounter Date: 07/22/2015      PT End of Session - 07/22/15 1501    Visit Number 3   Number of Visits 12   Date for PT Re-Evaluation 09/12/15   PT Start Time 0800   PT Stop Time 0900   PT Time Calculation (min) 60 min   Activity Tolerance Patient tolerated treatment well;No increased pain   Behavior During Therapy [facility] for tasks assessed/performed      Past Medical History  Diagnosis Date  . Anemia   . GERD (gastroesophageal reflux disease)   . High blood pressure     Past Surgical History  Procedure Laterality Date  . Appendectomy  97  . Robotically assisted laparoscopic ureteral re-implantation  93    There were no vitals filed for this visit.  Visit Diagnosis:  Posture abnormality  Lack of coordination      Subjective Assessment - 07/22/15 0801    Subjective Pt reported the body scan has helped her quality sleep improve by 90%. Pt find it helpful to fall asleep and stay asleep. Pt has found it helpful to separate herself from work mode to home mode. The mindfulness technique has led her to recognize when she would subconsciously tense up her pelvic floor mm.    Pertinent History Pt reports she has had IBS when she was 25 yo. Constipation has been a problem for most of her life.  Pt reports  daily bowel movements now but with  Type 2 on Bristol Stool Scale. Pt has increased her fiber. Hx : Appendectomy. Scoliosis (have worn a brace brace as a teen).  1 x week fitness routine: elliptical, crunches 25x , medicine ball    Patient Stated Goals get her body working better              [facility] PT Assessment - 07/22/15 1506    Posture/Postural Control   Posture Comments proer sitting  posture without cuing   Palpation   Palpation comment softer abdomen but noted holding tensions                  Pelvic Floor Special Questions - 07/22/15 1454    Pelvic Floor Internal Exam Pt consented verbally without contraindications   Exam Type Vaginal   Palpation tenderness/tensions at bulbospongious, ischiocavernosus, ischiococcygeus, obturator internus R > L , decreased tenderness and tensions post Tx  bladder position more dorsal in hooklying   Strength # of reps --  bladder position limited circumferential closure    Strength # of seconds --  focused on relaxating pelvic floor, not strengthening   Biofeedback elevator imagery, focus on relaxation of pelvic floor            [facility] Adult PT Treatment/Exercise - 07/22/15 1506    Neuro Re-ed    Neuro Re-ed Details  pelvic tilt to relax holding pattern of mm tensions over abdomen and to incorporate in pre-massage routine                  PT Education - 07/22/15 1501    Education provided Yes   Education Details HEP   Person(s) Educated Patient   Methods Explanation;Demonstration;Verbal cues;Tactile cues;Handout   Comprehension Returned demonstration;Verbalized understanding  PT Long Term Goals - 07/22/15 1504    PT LONG TERM GOAL #1   Title Pt will report decreased time from 30 min to < 15 min to completely urinate before bed.    Time 12   Period Weeks   Status On-going   PT LONG TERM GOAL #2   Title Pt will report frequency of urination during the day at every 2 hrs and at night every 1 hr before bed  in order to sleep and to work.    Time 12   Period Weeks   Status On-going   PT LONG TERM GOAL #3   Title Pt will report decreased LBP from 6-7/10 to < 2/10 in order to improve QOL.    Time 12   Period Weeks   Status On-going   PT LONG TERM GOAL #4   Title Pt will report ability to insert tampons with less difficulty from 70% to < 50% to promote hygiene and tolerate gyn exams.     Time 12   Period Weeks   Status On-going   PT LONG TERM GOAL #5   Title Pt will be IND and compliant with relaxation techniques in order to down train her nervous system independently for better pelvic floor and GI function. .    Time 12   Period Weeks   Status Achieved               Plan - 07/22/15 1502    Clinical Impression Statement Pt has been compliant w/ relaxation techniques and reported having improved her sleep and increased ability to de-stress from work. Pt demo'd increased mm tensions on R pelvic floor mm with possible contributing factors from scoliotic curve, habitual standing w/ weightbearing more on R > LLE, and abdominal scar adhesion over R LQ.  After Tx, pt demo'd decreased mm tensions and tenderness with improved lengthening and relaxation of pelvic floor mm. Pt showed good carry over with proper sitting posture and appeared less stressed/fatigued upon arrival to PT compared to previous session .  Plan to address scoliotic curve and pelvic girdle. Pt will continue to benefit from  skilled PT.     Pt will benefit from skilled therapeutic intervention in order to improve on the following deficits Decreased activity tolerance;Postural dysfunction;Increased muscle spasms;Improper body mechanics;Decreased strength;Pain;Hypomobility;Decreased coordination;Decreased endurance;Increased fascial restricitons   PT Frequency 1x / week   PT Duration 12 weeks   PT Treatment/Interventions ADLs/Self Care Home Management;Aquatic Therapy;Electrical Stimulation;Moist Heat;Traction;Ultrasound;Patient/family education;Neuromuscular re-education;Therapeutic exercise;Therapeutic activities;Functional mobility training;Stair training;Manual techniques;Scar mobilization;Energy conservation;Taping  relaxation training, pain science education   Consulted and Agree with Plan of Care Patient        Problem List Patient Active Problem List   Diagnosis Date Noted  . Frequent headaches  05/23/2015  . IBS (irritable bowel syndrome) 05/23/2015  . Asthma 05/23/2015  . Migraines 05/23/2015    Mariane Masters ,PT, DPT, E-RYT  07/22/2015, 3:10 PM  [facility] MAIN [facility] SERVICES [address] [place], [place], [address] Phone: [phone]   Fax:  [phone]  Name: Kristen Cervantes MRN: [id number] Date of Birth: [birth year]

## 2015-07-23 ENCOUNTER — Ambulatory Visit: Payer: 59 | Admitting: Urology

## 2015-07-23 ENCOUNTER — Encounter: Payer: Self-pay | Admitting: Urology

## 2015-07-24 ENCOUNTER — Ambulatory Visit: Payer: 59

## 2015-07-29 ENCOUNTER — Encounter: Payer: Self-pay | Admitting: Urology

## 2015-07-29 ENCOUNTER — Ambulatory Visit: Payer: 59 | Admitting: Urology

## 2015-07-29 ENCOUNTER — Ambulatory Visit (INDEPENDENT_AMBULATORY_CARE_PROVIDER_SITE_OTHER): Payer: 59 | Admitting: Urology

## 2015-07-29 VITALS — BP 120/82 | HR 86 | Ht 64.5 in | Wt 124.9 lb

## 2015-07-29 DIAGNOSIS — N398 Other specified disorders of urinary system: Secondary | ICD-10-CM | POA: Diagnosis not present

## 2015-07-29 NOTE — Progress Notes (Signed)
Patient returns to review urodynamics. She's been seen for frequency hesitancy and weak stream. Bothersome at night. 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 Recall she had a ureteral reimplant as a child.  Urodynamics 07/12/2015 revealed a normal capacity bladder were normal sensation and stable. There was no urge or stress incontinence. She voided with a detrusor pressure of 21 cm water with a flow of 7 mL/s and to completion. She had prolonged voiding with an interrupted flow pattern and flaring of the EMG due to poor relaxation of the external sphincter. I reviewed the imaging and the tracing went over these with the patient.  Assessment/plan: Dysfunctional voiding-she is already working with physical therapy on pelvic floor relaxation. She'll continue to focus on this. Urinary tract infections and urinary symptoms haven't been as much of an issue recently. We also discussed trial of tamsulosin but she rather avoid medicines right now. We'll plan to see her back in about 3 months for symptom check.

## 2015-08-02 ENCOUNTER — Ambulatory Visit: Payer: 59 | Attending: Obstetrics and Gynecology | Admitting: Physical Therapy

## 2015-08-02 DIAGNOSIS — R279 Unspecified lack of coordination: Secondary | ICD-10-CM | POA: Diagnosis not present

## 2015-08-02 DIAGNOSIS — R293 Abnormal posture: Secondary | ICD-10-CM | POA: Insufficient documentation

## 2015-08-02 NOTE — Patient Instructions (Signed)
You are now ready to begin training the deep core muscles system: diaphragm, transverse abdominis, pelvic floor . These muscles must work together as a team.           The key to these exercises to train the brain to coordinate the timing of these muscles and to have them turn on for long periods of time to hold you upright against gravity (especially important if you are on your feet all day).These muscles are postural muscles and play a role stabilizing your spine and bodyweight. By doing these repetitions slowly and correctly instead of doing crunches, you will achieve a flatter belly without a lower pooch. You are also placing your spine in a more neutral position and breathing properly which in turn, decreases your risk for problems related to your pelvic floor, abdominal, and low back such as pelvic organ prolapse, hernias, diastasis recti (separation of superficial muscles), disk herniations, spinal fractures. These exercises set a solid foundation for you to later progress to resistance/ strength training with therabands and weights and return to other typical fitness exercises with a stronger deeper core.  Level 2 10x 3 reps   Yellow band, latisimuss , pulling band by pockets on exhale .  Standing one leg forward hip width apart (semi tandem)   10 x 2 sets each leg forward

## 2015-08-03 NOTE — Therapy (Signed)
[place] MAIN [facility] SERVICES [address] [place], [place], [address] Phone: [phone]   Fax:  [phone]  Physical Therapy Treatment  Patient Details  Name: Kristen Cervantes MRN: [id number] Date of Birth: 03-Nov-1990 Referring Provider: Rutherford Nail   Encounter Date: 08/02/2015      PT End of Session - 08/02/15 0852    Visit Number 4   Number of Visits 12   Date for PT Re-Evaluation 09/12/15   PT Start Time 0805   PT Stop Time 0850   PT Time Calculation (min) 45 min   Activity Tolerance Patient tolerated treatment well;No increased pain   Behavior During Therapy [facility] for tasks assessed/performed      Past Medical History  Diagnosis Date  . Anemia   . GERD (gastroesophageal reflux disease)   . High blood pressure   . Migraines 05/23/2015  . Asthma 05/23/2015  . IBS (irritable bowel syndrome) 05/23/2015  . Frequent headaches 05/23/2015    Past Surgical History  Procedure Laterality Date  . Appendectomy  97  . Robotically assisted laparoscopic ureteral re-implantation  93    There were no vitals filed for this visit.  Visit Diagnosis:  Posture abnormality  Lack of coordination      Subjective Assessment - 08/02/15 0808    Subjective Pt reported noticing that she tenses her TMJ muscles and other muscles that she had not realized she had been tensing.    Pertinent History Pt reports she has had IBS when she was 25 yo. Constipation has been a problem for most of her life.  Pt reports  daily bowel movements now but with  Type 2 on Bristol Stool Scale. Pt has increased her fiber. Hx : Appendectomy. Scoliosis (have worn a brace brace as a teen).  1 x week fitness routine: elliptical, crunches 25x , medicine ball    Patient Stated Goals get her body working better                        Pelvic Floor Special Questions - 08/03/15 0907    Pelvic Floor Internal Exam Pt consented verbally without contraindications   Exam Type  Vaginal   Palpation minor tenderness at B obt int, R medial coccygeus   bladder in normal position   Strength # of reps --  bladder in normal position   Biofeedback elevator imagery            [facility] Adult PT Treatment/Exercise - 08/03/15 0910    Neuro Re-ed    Neuro Re-ed Details  deep core level 1-2  (20 rep), lat pull with yellow band (10 x 2)     Manual Therapy   Internal Pelvic Floor thiele massage and sustain pressure on problem areas, MET for obt int                  PT Education - 08/03/15 0918    Education provided Yes   Education Details HEP   Person(s) Educated Patient   Methods Explanation;Demonstration;Tactile cues;Verbal cues;Handout   Comprehension Returned demonstration;Verbalized understanding             PT Long Term Goals - 08/02/15 0601    PT LONG TERM GOAL #1   Title Pt will report decreased time from 30 min to < 15 min to completely urinate before bed.    Time 12   Period Weeks   Status Achieved   PT LONG TERM GOAL #2   Title  Pt will report frequency of urination during the day at every 2 hrs and at night every 1 hr before bed  in order to sleep and to work.    Time 12   Period Weeks   Status Achieved   PT LONG TERM GOAL #3   Title Pt will report decreased LBP from 6-7/10 to < 2/10 in order to improve QOL. (08/02/15 4/10)    Time 12   Period Weeks   Status Partially Met   PT LONG TERM GOAL #4   Title Pt will report ability to insert tampons with less difficulty from 70% to < 50% to promote hygiene and tolerate gyn exams.    Time 12   Period Weeks   Status On-going   PT LONG TERM GOAL #5   Title Pt will be IND and compliant with relaxation techniques in order to down train her nervous system independently for better pelvic floor and GI function. .    Time 12   Period Weeks   Status Achieved   Additional Long Term Goals   Additional Long Term Goals Yes   PT LONG TERM GOAL #6   Title Pt will demo no lumbopelvic instability with 5  reps of deep core level 1-4 exercises in order to manage LBP and to stand for long periods of time at work    Time [place] - 08/02/15 0859    Clinical Impression Statement Pt has achieved 3/6 goals with significantly improved ability to initiate urination. Pt showed good carry over with manual Tx from last visit with no tensions/tenderness at 1-2nd layer pelvic floor mm. Pt's remaining overactivity in her pelvic floor existed in the deeper layers bilaterally which was decreased post Tx. Initiated deep core strengthening 1-2 and latissmus strengthening. Plan to incorporate "w" band exercise at next session.  Pt is progressing well towards her goals.     Pt will benefit from skilled therapeutic intervention in order to improve on the following deficits Decreased activity tolerance;Postural dysfunction;Increased muscle spasms;Improper body mechanics;Decreased strength;Pain;Hypomobility;Decreased coordination;Decreased endurance;Increased fascial restricitons   Rehab Potential Good   PT Frequency 1x / week   PT Duration 12 weeks   PT Treatment/Interventions ADLs/Self Care Home Management;Aquatic Therapy;Electrical Stimulation;Moist Heat;Traction;Ultrasound;Patient/family education;Neuromuscular re-education;Therapeutic exercise;Therapeutic activities;Functional mobility training;Stair training;Manual techniques;Scar mobilization;Energy conservation;Taping  relaxation training, pain science education   Consulted and Agree with Plan of Care Patient        Problem List Patient Active Problem List   Diagnosis Date Noted  . Frequent headaches 05/23/2015  . IBS (irritable bowel syndrome) 05/23/2015  . Asthma 05/23/2015  . Migraines 05/23/2015    Jerl Mina ,PT, DPT, E-RYT  08/03/2015, 9:30 AM  [place] MAIN [facility] SERVICES [address] [place], [place], [address] Phone: [phone]   Fax:   [phone]  Name: Thanya Cegielski MRN: [id number] Date of Birth: Aug 04, 1990

## 2015-08-07 ENCOUNTER — Ambulatory Visit: Payer: 59 | Admitting: Physical Therapy

## 2015-08-13 ENCOUNTER — Ambulatory Visit: Payer: 59 | Admitting: Physical Therapy

## 2015-08-13 DIAGNOSIS — R279 Unspecified lack of coordination: Secondary | ICD-10-CM | POA: Diagnosis not present

## 2015-08-13 DIAGNOSIS — R293 Abnormal posture: Secondary | ICD-10-CM

## 2015-08-13 NOTE — Patient Instructions (Signed)
Bridges with yellow band   Upper arm are down, knees are hip th apart  10 x     Arms overhead (shoulders squeezing down) , heels are together , knees apart   10 x      Static modified side plank hold s 5 sec

## 2015-08-14 NOTE — Therapy (Signed)
[facility] [address] [place], [place], [address] Phone: [phone]   Fax:  [phone]  Physical Therapy Treatment / Discharge Summary   Patient Details  Name: Kristen ChamberCasey Yaw MRN: [id number] Date of Birth: Sep 06, 1990 Referring Provider: Dreama Saaverton   Encounter Date: 08/13/2015      PT End of Session - 08/14/15 2347    Visit Number 5   Number of Visits 12   Date for PT Re-Evaluation 09/12/15   PT Start Time 1600   PT Stop Time 1635   PT Time Calculation (min) 35 min   Activity Tolerance Patient tolerated treatment well;No increased pain   Behavior During Therapy [facility] for tasks assessed/performed      Past Medical History  Diagnosis Date  . Anemia   . GERD (gastroesophageal reflux disease)   . High blood pressure   . Migraines 05/23/2015  . Asthma 05/23/2015  . IBS (irritable bowel syndrome) 05/23/2015  . Frequent headaches 05/23/2015    Past Surgical History  Procedure Laterality Date  . Appendectomy  97  . Robotically assisted laparoscopic ureteral re-implantation  93    There were no vitals filed for this visit.  Visit Diagnosis:  Posture abnormality  Lack of coordination          [facility] Assessment - 08/14/15 2348    Observation/Other Assessments   Observations bright affect, relaxed, upright posture in sitting, standing, walking                     [facility] Adult PT Treatment/Exercise - 08/14/15 2347    Neuro Re-ed    Neuro Re-ed Details  see pt instructions                     PT Long Term Goals - 08/13/15 1627    PT LONG TERM GOAL #1   Title Pt will report decreased time from 30 min to < 15 min to completely urinate before bed.    Time 12   Period Weeks   Status Achieved   PT LONG TERM GOAL #2   Title Pt will report frequency of urination during the day at every 2 hrs and at night every 1 hr before bed  in order to sleep and to work.    Time 12   Period Weeks    Status Achieved   PT LONG TERM GOAL #3   Title Pt will report decreased LBP from 6-7/10 to < 2/10 in order to improve QOL. (08/02/15 4/10, 3/14: 2/10)    Time 12   Period Weeks   Status Achieved   PT LONG TERM GOAL #4   Title Pt will report ability to insert tampons with less difficulty from 70% to < 50% to promote hygiene and tolerate gyn exams.    Time 12   Period Weeks   Status Achieved   PT LONG TERM GOAL #5   Title Pt will be IND and compliant with relaxation techniques in order to down train her nervous system independently for better pelvic floor and GI function. .    Time 12   Period Weeks   Status Achieved   PT LONG TERM GOAL #6   Title Pt will demo no lumbopelvic instability with 5 reps of deep core level 1-4 exercises in order to manage LBP and to stand for long periods of time at work    Time 12   Period Weeks   Status Achieved  Plan - 08/13/15 1628    Clinical Impression Statement Across the past 5 visits since [facility], py reported her Sx with LBP, initating urination, and inserting tampons have improved "Quite a Bit" based on the GROC scale. Pt has achieved 100% of her goals. Pt has been a pleasure to work with; she responded well to manual Tx and remained dedicated and compliant to relaxation techniques, HEP of stretches, and body mechanics training, and deep core strengthening.  Pt is ready for d/c.     Pt will benefit from skilled therapeutic intervention in order to improve on the following deficits Decreased activity tolerance;Postural dysfunction;Increased muscle spasms;Improper body mechanics;Decreased strength;Pain;Hypomobility;Decreased coordination;Decreased endurance;Increased fascial restricitons   Rehab Potential Good   PT Frequency 1x / week   PT Duration 12 weeks   PT Treatment/Interventions ADLs/Self Care Home Management;Aquatic Therapy;Electrical Stimulation;Moist Heat;Traction;Ultrasound;Patient/family education;Neuromuscular  re-education;Therapeutic exercise;Therapeutic activities;Functional mobility training;Stair training;Manual techniques;Scar mobilization;Energy conservation;Taping  relaxation training, pain science education   Consulted and Agree with Plan of Care Patient        Problem List Patient Active Problem List   Diagnosis Date Noted  . Frequent headaches 05/23/2015  . IBS (irritable bowel syndrome) 05/23/2015  . Asthma 05/23/2015  . Migraines 05/23/2015    Kristen Cervantes ,PT, DPT, E-RYT  08/14/2015, 11:48 PM  [place] MAIN [facility] SERVICES [address] [place], [place], [address] Phone: [phone]   Fax:  [phone]  Name: Kristen Cervantes MRN: [id number] Date of Birth: [birth year]

## 2015-08-28 ENCOUNTER — Encounter: Payer: 59 | Admitting: Physical Therapy

## 2015-11-04 ENCOUNTER — Ambulatory Visit: Payer: 59 | Admitting: Urology

## 2016-03-16 ENCOUNTER — Encounter: Payer: Self-pay | Admitting: Physician Assistant

## 2016-03-16 ENCOUNTER — Ambulatory Visit (INDEPENDENT_AMBULATORY_CARE_PROVIDER_SITE_OTHER): Payer: 59 | Admitting: Physician Assistant

## 2016-03-16 VITALS — BP 110/80 | HR 88 | Temp 98.3°F | Resp 16 | Ht 64.0 in | Wt 120.0 lb

## 2016-03-16 DIAGNOSIS — Z8349 Family history of other endocrine, nutritional and metabolic diseases: Secondary | ICD-10-CM

## 2016-03-16 DIAGNOSIS — R938 Abnormal findings on diagnostic imaging of other specified body structures: Secondary | ICD-10-CM | POA: Diagnosis not present

## 2016-03-16 DIAGNOSIS — R9389 Abnormal findings on diagnostic imaging of other specified body structures: Secondary | ICD-10-CM

## 2016-03-16 NOTE — Patient Instructions (Signed)
Salivary Stone A salivary stone is a mineral deposit that builds up in the ducts that drain your salivary glands. Most salivary gland stones are made of calcium. When a stone forms, saliva can back up into the gland and cause painful swelling. Your salivary glands are the glands that produce spit (saliva). You have six major salivary glands. Each gland has a duct that carries saliva into your mouth. Saliva keeps your mouth moist and breaks down the food that you eat. It also helps to prevent tooth decay. Two salivary glands are located just in front of your ears (parotid). The ducts for these glands open up inside your cheeks, near your back teeth. You also have two glands under your tongue (sublingual) and two glands under your jaw (submandibular). The ducts for these glands open under your tongue. A stone can form in any salivary gland. The most common place for a salivary stone to develop is in a submandibular salivary gland. CAUSES Any condition that reduces the flow of saliva may lead to stone formation. It is not known why some people form stones and others do not.  RISK FACTORS You may be more likely to develop a salivary stone if you:  Are female.  Do not drink enough water.  Smoke.  Have high blood pressure.  Have gout.  Have diabetes. SIGNS AND SYMPTOMS The main sign of a salivary gland stone is sudden swelling of a salivary gland when eating. This usually happens under the jaw on one side. Other signs and symptoms include:  Swelling of the cheek or under the tongue when eating.  Pain in the swollen area.  Trouble chewing or swallowing.  Swelling that goes down after eating. DIAGNOSIS Your health care provider may diagnose a salivary gland stone based on your signs and symptoms. The health care provider will also do a physical exam. In many cases, a stone can be felt in a duct inside your mouth. You may need to see an ear, nose, and throat specialist (ENT or otolaryngologist)  for diagnosis and treatment. You may also need to have diagnostic tests. These may include imaging studies to check for a stone, such as:  X-rays.  Ultrasound.  CT scan.  MRI. TREATMENT Home care may be enough to treat a small stone that is not causing symptoms. Treatment of a stone that is large enough to cause symptoms may include:  Probing and widening the duct to allow the stone to pass.  Inserting a thin, flexible scope (endoscope) into the duct to locate and remove the stone.  Breaking up the stone with sound waves.  Removing the entire salivary gland. HOME CARE INSTRUCTIONS  Drink enough fluid to keep your urine clear or pale yellow.  Follow these instructions every few hours:  Suck on a lemon candy to stimulate the flow of saliva.  Put a hot compress over the gland.  Gently massage the gland.  Do not use any tobacco products, including cigarettes, chewing tobacco, or electronic cigarettes. If you need help quitting, ask your health care provider. SEEK MEDICAL CARE IF:  You have pain and swelling in your face, jaw, or mouth after eating.  You have persistent swelling in any of these places:  In front of your ear.  Under your jaw.  Inside your mouth. SEEK IMMEDIATE MEDICAL CARE IF:  You have pain and swelling in your face, jaw, or mouth that are getting worse.  Your pain and swelling make it hard to swallow or breathe.     This information is not intended to replace advice given to you by your health care provider. Make sure you discuss any questions you have with your health care provider.   Document Released: 06/25/2004 Document Revised: 06/08/2014 Document Reviewed: 10/18/2013 Elsevier Interactive Patient Education 2016 Elsevier Inc.  

## 2016-03-16 NOTE — Progress Notes (Signed)
Patient: Kristen Cervantes Female    DOB: 17-Jan-1991   25 y.o.   MRN: [id number] Visit Date: 03/16/2016  Today's Provider: Margaretann Loveless, PA-C   Chief Complaint  Patient presents with  . Follow-up    on x-ray done by dentist   Subjective:    HPI Patient here to follow-up on an x-ray done by her dentist Dr. Genevie Ann. Per dentist he noticed a very small circular area calcification/atheromas in the carotid region bilaterally on her panoramic radiograph.  Patient does not have any family history of early CAD. She is not obese, does not smoke, no diabetes. She has had a history of tonsillar stones. Sister had a cyst on her thyroid that had to be removed. She denies any weakness or numbness on one side of the body or the other, amaurosis fugax, visual changes, difficulties speaking or swallowing.     Allergies  Allergen Reactions  . Other     Cats: Asthma, watery eyes, runny nose     Current Outpatient Prescriptions:  .  Albuterol (PROVENTIL IN), Inhale into the lungs as needed., Disp: , Rfl:  .  cetirizine (ZYRTEC) 10 MG tablet, Take 10 mg by mouth daily., Disp: , Rfl:  .  dicyclomine (BENTYL) 20 MG tablet, Take 1 tablet (20 mg total) by mouth 3 (three) times daily as needed for spasms., Disp: 30 tablet, Rfl: 11 .  fluticasone (FLONASE) 50 MCG/ACT nasal spray, Place 2 sprays into both nostrils daily., Disp: 16 g, Rfl: 11 .  montelukast (SINGULAIR) 10 MG tablet, Take 1 tablet (10 mg total) by mouth daily., Disp: 30 tablet, Rfl: 11 .  SUMAtriptan (IMITREX) 6 MG/0.5ML SOLN injection, Inject 0.5 mLs (6 mg total) into the skin as needed for migraine or headache. May repeat in 2 hours if headache persists or recurs., Disp: 10 vial, Rfl: 3 .  SUMAtriptan-naproxen (TREXIMET) 85-500 MG tablet, Take 1 tablet by mouth as needed., Disp: 10 tablet, Rfl: 6 .  tiZANidine (ZANAFLEX) 4 MG tablet, Take 1 tablet (4 mg total) by mouth once., Disp: 30 tablet, Rfl: 11 .  topiramate (TOPAMAX) 100  MG tablet, Take 1 tablet (100 mg total) by mouth 2 (two) times daily., Disp: 60 tablet, Rfl: 11  Review of Systems  Constitutional: Negative.   Respiratory: Negative.   Cardiovascular: Negative.   Gastrointestinal: Negative.   Endocrine: Negative.   Neurological: Negative.     Social History  Substance Use Topics  . Smoking status: Never Smoker  . Smokeless tobacco: Never Used  . Alcohol use Yes     Comment: 3x a week   Objective:   BP 110/80 (BP Location: Left Arm, Patient Position: Sitting, Cuff Size: Normal)   Pulse 88   Temp 98.3 F (36.8 C) (Oral)   Resp 16   Ht 5\' 4"  (1.626 m)   Wt 120 lb (54.4 kg)   LMP 02/28/2016 (Exact Date)   SpO2 99%   BMI 20.60 kg/m   Physical Exam  Constitutional: She appears well-developed and well-nourished. No distress.  HENT:  Head: Normocephalic and atraumatic.  Right Ear: Hearing, tympanic membrane, external ear and ear canal normal.  Left Ear: Hearing, tympanic membrane, external ear and ear canal normal.  Nose: Nose normal.  Mouth/Throat: Uvula is midline, oropharynx is clear and moist and mucous membranes are normal. No oropharyngeal exudate, posterior oropharyngeal edema or posterior oropharyngeal erythema.  No visible tonsillar or other salivary gland stones  Eyes: Conjunctivae are normal. Pupils are  equal, round, and reactive to light. Right eye exhibits no discharge. Left eye exhibits no discharge. No scleral icterus.  Neck: Normal range of motion. Neck supple. No tracheal deviation present. No thyromegaly present.    Cardiovascular: Normal rate, regular rhythm and normal heart sounds.  Exam reveals no gallop and no friction rub.   No murmur heard. Pulmonary/Chest: Effort normal and breath sounds normal. No stridor. No respiratory distress. She has no wheezes. She has no rhonchi. She has no rales.  Lymphadenopathy:    She has no cervical adenopathy.  Skin: Skin is warm and dry. She is not diaphoretic.  Vitals reviewed.       Assessment & Plan:     1. Family history of thyroid disease in sister Will check TSH as below. If abnormal will consider neck US due to family history and abnormal xray from dental office. Discussed with Dr. Sullivan LoneGilbert and reviewed report and xray image. Will also discuss with Dr. Gwen PoundsKowalski, cardiology, due to abnormality on xray. Low suspicion of carotid atheroma. Will f/u with results. - Thyroid Panel With TSH  2. Abnormal x-ray See above medical treatment plan.       Margaretann LovelessJennifer M Sherika Kubicki, PA-C  [facility]

## 2016-03-17 DIAGNOSIS — Z8349 Family history of other endocrine, nutritional and metabolic diseases: Secondary | ICD-10-CM | POA: Diagnosis not present

## 2016-03-18 ENCOUNTER — Telehealth: Payer: Self-pay

## 2016-03-18 DIAGNOSIS — Z8349 Family history of other endocrine, nutritional and metabolic diseases: Secondary | ICD-10-CM

## 2016-03-18 DIAGNOSIS — R9389 Abnormal findings on diagnostic imaging of other specified body structures: Secondary | ICD-10-CM

## 2016-03-18 LAB — THYROID PANEL WITH TSH
FREE THYROXINE INDEX: 1.6 (ref 1.2–4.9)
T3 UPTAKE RATIO: 28 % (ref 24–39)
T4 TOTAL: 5.8 ug/dL (ref 4.5–12.0)
TSH: 4.17 u[IU]/mL (ref 0.450–4.500)

## 2016-03-18 NOTE — Telephone Encounter (Signed)
-----   Message from Margaretann LovelessJennifer M Burnette, [place]PA-C sent at 03/18/2016 10:05 AM EDT ----- Thyroid level is WNL but has increased from labs that were done 9 months ago. May be normal fluctuation but with family history and abnormal xray may be beneficial to get US of thyroid as well just to make sure no nodules or cysts in thyroid. When we US neck for thyroid we can have them do a carotid duplex just for verification. If agreed I will order for patient.

## 2016-03-18 NOTE — Telephone Encounter (Signed)
Patient advised and agrees to treatment plan.

## 2016-03-19 ENCOUNTER — Other Ambulatory Visit: Payer: Self-pay | Admitting: Physician Assistant

## 2016-03-19 DIAGNOSIS — G43119 Migraine with aura, intractable, without status migrainosus: Secondary | ICD-10-CM

## 2016-03-23 ENCOUNTER — Ambulatory Visit
Admission: RE | Admit: 2016-03-23 | Discharge: 2016-03-23 | Disposition: A | Discharge status: Home / Self Care | Payer: 59 | Source: Ambulatory Visit | Attending: Physician Assistant | Admitting: Physician Assistant

## 2016-03-23 DIAGNOSIS — E042 Nontoxic multinodular goiter: Secondary | ICD-10-CM | POA: Insufficient documentation

## 2016-03-23 DIAGNOSIS — R938 Abnormal findings on diagnostic imaging of other specified body structures: Secondary | ICD-10-CM | POA: Diagnosis not present

## 2016-03-23 DIAGNOSIS — I6523 Occlusion and stenosis of bilateral carotid arteries: Secondary | ICD-10-CM | POA: Diagnosis not present

## 2016-03-23 DIAGNOSIS — Z8349 Family history of other endocrine, nutritional and metabolic diseases: Secondary | ICD-10-CM | POA: Diagnosis not present

## 2016-03-23 DIAGNOSIS — R9389 Abnormal findings on diagnostic imaging of other specified body structures: Secondary | ICD-10-CM

## 2016-03-24 ENCOUNTER — Telehealth: Payer: Self-pay

## 2016-03-24 NOTE — Telephone Encounter (Signed)
-----   Message from Margaretann LovelessJennifer M Burnette, [place]PA-C sent at 03/23/2016  5:24 PM EDT ----- No carotid atherosclerosis (plaque build up). Still awaiting thyroid US results.

## 2016-03-24 NOTE — Telephone Encounter (Signed)
If she wishes I can refer her to endocrine for further consideration of treatment.

## 2016-03-24 NOTE — Telephone Encounter (Signed)
Pt returned Laura's call.  Thank sTeri

## 2016-03-24 NOTE — Telephone Encounter (Signed)
LMTCB 03/24/2016  Thanks,   -Jacquelynne Guedes  

## 2016-03-24 NOTE — Telephone Encounter (Signed)
Patient advised as directed below. She reports that she is feeling very fatigue and lost a lot of weight. She reports that her sister had the same symptoms. She reports that she know that the only thing more to do is removing them or repeating the lab testing. She wants to know what you think.  Thanks,  -Davaris Youtsey

## 2016-03-24 NOTE — Telephone Encounter (Signed)
Pt advised.   Thanks,   -Macee Venables  

## 2016-03-24 NOTE — Telephone Encounter (Signed)
-----   Message from Margaretann LovelessJennifer M Burnette, [place]-C sent at 03/24/2016  1:35 PM EDT ----- Thyroid US shows multi nodular goiter with very small nodules noted on each side. All are too small for biopsy and there is no recommended f/u for this with imaging at this time. We will continue to monitor with checking your TSH levels and will recheck them in 3-6 months. Also please let us know if you notice any difficulties swallowing or change in size/or feel any change in the neck.

## 2016-03-25 NOTE — Telephone Encounter (Signed)
LMTCB  Thanks,  -Joseline 

## 2016-03-25 NOTE — Telephone Encounter (Signed)
Spoke with Ilyana and she wants the referral but prefers an Actor in [place] she will call back with the information.  Thanks,  -Caedon Bond

## 2016-03-31 ENCOUNTER — Encounter: Payer: Self-pay | Admitting: Physician Assistant

## 2016-05-27 ENCOUNTER — Ambulatory Visit (INDEPENDENT_AMBULATORY_CARE_PROVIDER_SITE_OTHER): Payer: 59 | Admitting: Physician Assistant

## 2016-05-27 ENCOUNTER — Encounter: Payer: Self-pay | Admitting: Physician Assistant

## 2016-05-27 VITALS — BP 120/80 | HR 105 | Temp 98.1°F | Resp 16 | Ht 64.0 in | Wt 129.8 lb

## 2016-05-27 DIAGNOSIS — Z1231 Encounter for screening mammogram for malignant neoplasm of breast: Secondary | ICD-10-CM

## 2016-05-27 DIAGNOSIS — E042 Nontoxic multinodular goiter: Secondary | ICD-10-CM | POA: Diagnosis not present

## 2016-05-27 DIAGNOSIS — G43829 Menstrual migraine, not intractable, without status migrainosus: Secondary | ICD-10-CM

## 2016-05-27 DIAGNOSIS — Z0000 Encounter for general adult medical examination without abnormal findings: Secondary | ICD-10-CM | POA: Diagnosis not present

## 2016-05-27 DIAGNOSIS — J301 Allergic rhinitis due to pollen: Secondary | ICD-10-CM

## 2016-05-27 DIAGNOSIS — Z30011 Encounter for initial prescription of contraceptive pills: Secondary | ICD-10-CM

## 2016-05-27 DIAGNOSIS — Z1239 Encounter for other screening for malignant neoplasm of breast: Secondary | ICD-10-CM

## 2016-05-27 MED ORDER — CETIRIZINE HCL 10 MG PO TABS: 10.0000 mg | ORAL_TABLET | Freq: Every day | ORAL | 1 refills | Status: AC

## 2016-05-27 MED ORDER — NORETHINDRONE ACET-ETHINYL EST 1-20 MG-MCG PO TABS: 1.0000 | ORAL_TABLET | Freq: Every day | ORAL | 3 refills | Status: AC

## 2016-05-27 NOTE — Progress Notes (Signed)
Patient: Kristen Cervantes, Female    DOB: 06-23-1990, 25 y.o.   MRN: [id number] Visit Date: 05/27/2016  Today's Provider: Margaretann LovelessJennifer M Burnette, PA-C   Chief Complaint  Patient presents with  . Annual Exam   Subjective:    Annual physical exam Kristen CapriceCasey N Cervantes is a 25 y.o. female who presents today for health maintenance and complete physical. She feels fairly well. She reports exercising none. She reports she is sleeping fairly well.  ----------------------------------------------------------------- Headache: Patient presents with headache. She reports that her headaches are uncontrolled almost to a migraine. She has been tracking her headache and usually are around her menstrual cycle and reports that she has been using her Treximet more often.  Recently, the headaches are increasing in severity.   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   Review of Systems  Constitutional: Positive for fatigue.  HENT: Positive for congestion, sinus pressure and tinnitus.   Eyes: Negative.   Respiratory: Negative.   Cardiovascular: Negative.   Gastrointestinal: Negative.   Endocrine: Negative.   Genitourinary: Negative.   Musculoskeletal: Negative.   Skin: Negative.   Allergic/Immunologic: Positive for environmental allergies.  Neurological: Positive for headaches.  Hematological: Negative.   Psychiatric/Behavioral: Negative.     Social History      She  reports that she has never smoked. She has never used smokeless tobacco. She reports that she drinks alcohol. She reports that she does not use drugs.       Social History   Social History  . Marital status: Single    Spouse name: N/A  . Number of children: N/A  . Years of education: N/A   Social History Main Topics  . Smoking status: Never Smoker  . Smokeless tobacco: Never Used  . Alcohol use Yes     Comment: 3x a  week  . Drug use: No  . Sexual activity: Not Asked   Other Topics Concern  . None   Social History Narrative  . None    Past Medical History:  Diagnosis Date  . Anemia   . Asthma 05/23/2015  . Frequent headaches 05/23/2015  . GERD (gastroesophageal reflux disease)   . High blood pressure   . IBS (irritable bowel syndrome) 05/23/2015  . Migraines 05/23/2015     Patient Active Problem List   Diagnosis Date Noted  . Frequent headaches 05/23/2015  . IBS (irritable bowel syndrome) 05/23/2015  . Asthma 05/23/2015  . Migraines 05/23/2015    Past Surgical History:  Procedure Laterality Date  . APPENDECTOMY  97  . ROBOTICALLY ASSISTED LAPAROSCOPIC URETERAL RE-IMPLANTATION  8593    Family History        Family Status  Relation Status  . Mother Alive  . Father Alive  . Sister Alive        Her family history includes Anemia in her mother; Cancer in her mother; Fibromyalgia in her mother; Hyperlipidemia in her father and mother; Thyroid disease in her sister.     Allergies  Allergen Reactions  . Other     Cats: Asthma, watery eyes, runny nose     Current Outpatient Prescriptions:  .  Albuterol (PROVENTIL IN), Inhale into the lungs as needed., Disp: , Rfl:  .  cetirizine (ZYRTEC) 10 MG tablet, Take 10 mg by mouth daily., Disp: , Rfl:  .  dicyclomine (BENTYL) 20  MG tablet, Take 1 tablet (20 mg total) by mouth 3 (three) times daily as needed for spasms., Disp: 30 tablet, Rfl: 11 .  fluticasone (FLONASE) 50 MCG/ACT nasal spray, Place 2 sprays into both nostrils daily., Disp: 16 g, Rfl: 11 .  montelukast (SINGULAIR) 10 MG tablet, Take 1 tablet (10 mg total) by mouth daily., Disp: 30 tablet, Rfl: 11 .  Multiple Vitamin (MULTIVITAMIN) capsule, Take 1 capsule by mouth daily., Disp: , Rfl:  .  SUMAtriptan (IMITREX) 6 MG/0.5ML SOLN injection, Inject 0.5 mLs (6 mg total) into the skin as needed for migraine or headache. May repeat in 2 hours if headache persists or recurs., Disp:  10 vial, Rfl: 3 .  tiZANidine (ZANAFLEX) 4 MG tablet, Take 1 tablet (4 mg total) by mouth once., Disp: 30 tablet, Rfl: 11 .  topiramate (TOPAMAX) 100 MG tablet, Take 1 tablet (100 mg total) by mouth 2 (two) times daily., Disp: 60 tablet, Rfl: 11 .  TREXIMET 85-500 MG tablet, TAKE 1 TABLET BY MOUTH AS NEEDED., Disp: 9 tablet, Rfl: 6   Patient Care Team: Margaretann Loveless, PA-C as PCP - General (Family Medicine)      Objective:   Vitals: BP 120/80 (BP Location: Right Arm, Patient Position: Sitting, Cuff Size: Normal)   Pulse (!) 105   Temp 98.1 F (36.7 C) (Oral)   Resp 16   Ht 5\' 4"  (1.626 m)   Wt 129 lb 12.8 oz (58.9 kg)   LMP 05/21/2016   BMI 22.28 kg/m    Physical Exam  Constitutional: She is oriented to person, place, and time. She appears well-developed and well-nourished. No distress.  HENT:  Head: Normocephalic and atraumatic.  Right Ear: Hearing, tympanic membrane, external ear and ear canal normal.  Left Ear: Hearing, tympanic membrane, external ear and ear canal normal.  Nose: Nose normal.  Mouth/Throat: Uvula is midline, oropharynx is clear and moist and mucous membranes are normal. No oropharyngeal exudate.  Eyes: Conjunctivae and EOM are normal. Pupils are equal, round, and reactive to light. Right eye exhibits no discharge. Left eye exhibits no discharge. No scleral icterus.  Neck: Normal range of motion. Neck supple. No JVD present. No tracheal deviation present. No thyromegaly (known multi-nodule thyroid gland but none are palpable) present.  Cardiovascular: Normal rate, regular rhythm, normal heart sounds and intact distal pulses.  Exam reveals no gallop and no friction rub.   No murmur heard. Pulmonary/Chest: Effort normal and breath sounds normal. No respiratory distress. She has no wheezes. She has no rales. She exhibits no tenderness. Right breast exhibits no inverted nipple, no mass, no nipple discharge, no skin change and no tenderness. Left breast exhibits  no inverted nipple, no mass, no nipple discharge, no skin change and no tenderness. Breasts are symmetrical.  Abdominal: Soft. Bowel sounds are normal. She exhibits no distension and no mass. There is no tenderness. There is no rebound and no guarding.  Musculoskeletal: Normal range of motion. She exhibits no edema or tenderness.  Lymphadenopathy:    She has no cervical adenopathy.  Neurological: She is alert and oriented to person, place, and time.  Skin: Skin is warm and dry. No rash noted. She is not diaphoretic.  Psychiatric: She has a normal mood and affect. Her behavior is normal. Judgment and thought content normal.  Vitals reviewed.    Depression Screen No flowsheet data found.    Assessment & Plan:     Routine Health Maintenance and Physical Exam  Exercise Activities and Dietary  recommendations Goals    None       There is no immunization history on file for this patient.  Health Maintenance  Topic Date Due  . HIV Screening  03/19/2006  . TETANUS/TDAP  03/19/2010  . INFLUENZA VACCINE  12/31/2015  . PAP SMEAR  05/22/2018     Discussed health benefits of physical activity, and encouraged her to engage in regular exercise appropriate for her age and condition.    1. Annual physical exam Normal physical exam today. Will check labs as below and f/u pending lab results. If labs are stable and WNL she will not need to have these rechecked for one year at her next annual physical exam. She is to call the office in the meantime if she has any acute issue, questions or concerns. - CBC with Differential/Platelet - Comprehensive metabolic panel - Thyroid Panel With TSH  2. Breast cancer screening Normal breast exam.  3. Menstrual migraine without status migrainosus, not intractable Worsening. Will add OCP as below to see if this may help her menstrual migraines. She is to call if she does not tolerate this dose or has BTB she is to call the office. -  norethindrone-ethinyl estradiol (LOESTRIN 1/20, 21,) 1-20 MG-MCG tablet; Take 1 tablet by mouth daily.  Dispense: 1 Package; Refill: 3  4. Encounter for oral contraception initial prescription See above medical treatment plan. - norethindrone-ethinyl estradiol (LOESTRIN 1/20, 21,) 1-20 MG-MCG tablet; Take 1 tablet by mouth daily.  Dispense: 1 Package; Refill: 3  5. Multinodular goiter H/O this noted on US. Will check labs as below and f/u pending results. She is going to establish with endocrinology in [place]. - Thyroid Panel With TSH  6. Chronic seasonal allergic rhinitis due to pollen Stable. Diagnosis pulled for medication refill. Continue current medical treatment plan. - cetirizine (ZYRTEC) 10 MG tablet; Take 1 tablet (10 mg total) by mouth daily.  Dispense: 90 tablet; Refill: 1  --------------------------------------------------------------------    Margaretann LovelessJennifer M Burnette, PA-C  [facility]

## 2016-05-27 NOTE — Patient Instructions (Signed)

## 2016-05-28 ENCOUNTER — Telehealth: Payer: Self-pay

## 2016-05-28 LAB — CBC WITH DIFFERENTIAL/PLATELET
BASOS ABS: 0 10*3/uL (ref 0.0–0.2)
Basos: 1 %
EOS (ABSOLUTE): 0.1 10*3/uL (ref 0.0–0.4)
Eos: 3 %
HEMATOCRIT: 37.5 % (ref 34.0–46.6)
Hemoglobin: 12.6 g/dL (ref 11.1–15.9)
Immature Grans (Abs): 0 10*3/uL (ref 0.0–0.1)
Immature Granulocytes: 0 %
LYMPHS ABS: 1.1 10*3/uL (ref 0.7–3.1)
Lymphs: 29 %
MCH: 30.7 pg (ref 26.6–33.0)
MCHC: 33.6 g/dL (ref 31.5–35.7)
MCV: 91 fL (ref 79–97)
MONOS ABS: 0.2 10*3/uL (ref 0.1–0.9)
Monocytes: 6 %
Neutrophils Absolute: 2.3 10*3/uL (ref 1.4–7.0)
Neutrophils: 61 %
Platelets: 191 10*3/uL (ref 150–379)
RBC: 4.11 x10E6/uL (ref 3.77–5.28)
RDW: 12.8 % (ref 12.3–15.4)
WBC: 3.7 10*3/uL (ref 3.4–10.8)

## 2016-05-28 LAB — COMPREHENSIVE METABOLIC PANEL
ALK PHOS: 46 IU/L (ref 39–117)
ALT: 19 IU/L (ref 0–32)
AST: 15 IU/L (ref 0–40)
Albumin/Globulin Ratio: 1.6 (ref 1.2–2.2)
Albumin: 4.4 g/dL (ref 3.5–5.5)
BUN/Creatinine Ratio: 20 (ref 9–23)
BUN: 16 mg/dL (ref 6–20)
CHLORIDE: 104 mmol/L (ref 96–106)
CO2: 20 mmol/L (ref 18–29)
CREATININE: 0.82 mg/dL (ref 0.57–1.00)
Calcium: 9.2 mg/dL (ref 8.7–10.2)
GFR calc Af Amer: 115 mL/min/{1.73_m2} (ref >59)
GFR calc non Af Amer: 100 mL/min/{1.73_m2} (ref >59)
GLOBULIN, TOTAL: 2.7 g/dL (ref 1.5–4.5)
GLUCOSE: 100 mg/dL — AB (ref 65–99)
Potassium: 4.2 mmol/L (ref 3.5–5.2)
SODIUM: 140 mmol/L (ref 134–144)
Total Protein: 7.1 g/dL (ref 6.0–8.5)

## 2016-05-28 LAB — THYROID PANEL WITH TSH
Free Thyroxine Index: 1.6 (ref 1.2–4.9)
T3 Uptake Ratio: 27 % (ref 24–39)
T4 TOTAL: 5.8 ug/dL (ref 4.5–12.0)
TSH: 1.55 u[IU]/mL (ref 0.450–4.500)

## 2016-05-28 NOTE — Telephone Encounter (Signed)
-----   Message from Jennifer M Burnette, PA-C sent at 05/28/2016  9:28 AM EST ----- All labs are within normal limits and stable.  Thanks! -JB 

## 2016-05-28 NOTE — Telephone Encounter (Signed)
Patient advised as directed below.  Thanks,  -Shakita Keir 

## 2016-05-30 DIAGNOSIS — G43111 Migraine with aura, intractable, with status migrainosus: Secondary | ICD-10-CM | POA: Diagnosis not present

## 2016-06-10 ENCOUNTER — Other Ambulatory Visit: Payer: Self-pay | Admitting: Physician Assistant

## 2016-06-10 DIAGNOSIS — G43119 Migraine with aura, intractable, without status migrainosus: Secondary | ICD-10-CM

## 2016-06-10 NOTE — Telephone Encounter (Signed)
Can we call Kristen Cervantes and let her know the insurance is not wanting to cover the treximet. They are wanting her to have sumatriptan 100mg  and naproxen 500mg  tablets separately. If she is ok with this I will send in new Rxs and see if she is in [place] now and needs her pharmacy changed. Thanks!

## 2016-06-11 MED ORDER — SUMATRIPTAN SUCCINATE 50 MG PO TABS: 100.0000 mg | ORAL_TABLET | ORAL | 0 refills | Status: DC | PRN

## 2016-06-11 MED ORDER — SUMATRIPTAN SUCCINATE 50 MG PO TABS: 100.0000 mg | ORAL_TABLET | ORAL | 0 refills | Status: AC | PRN

## 2016-06-11 MED ORDER — NAPROXEN 500 MG PO TABS: ORAL_TABLET | ORAL | 3 refills | Status: AC

## 2016-06-11 MED FILL — SUMATRIPTAN SUCC 50 MG TAB: 50 | 30 days supply | Qty: 9 | Fill #0

## 2016-06-11 MED FILL — NAPROXEN 500 MG TABLET: 500 | 30 days supply | Qty: 30 | Fill #0

## 2016-06-11 NOTE — Telephone Encounter (Signed)
Meds sent to [facility]

## 2016-06-11 NOTE — Telephone Encounter (Signed)
LMTCB  Thanks,  -Tyrease Vandeberg 

## 2016-06-11 NOTE — Telephone Encounter (Signed)
Patient advised as directed below. She is ok for Sumatriptan 100mg  and naproxen 500mg  to be send separately She [facility]. She is also requesting Refill on her Topamax.  Thanks,  -Arvin Abello

## 2016-06-19 ENCOUNTER — Telehealth: Payer: Self-pay | Admitting: Physician Assistant

## 2016-06-19 DIAGNOSIS — G43119 Migraine with aura, intractable, without status migrainosus: Secondary | ICD-10-CM

## 2016-06-19 MED ORDER — TOPIRAMATE 100 MG PO TABS: 100.0000 mg | ORAL_TABLET | Freq: Two times a day (BID) | ORAL | 11 refills | Status: AC

## 2016-06-19 MED FILL — TOPIRAMATE 100 MG TABLET: 100 | 30 days supply | Qty: 60 | Fill #0

## 2016-06-19 NOTE — Telephone Encounter (Signed)
Pt advised-aa 

## 2016-06-19 NOTE — Telephone Encounter (Signed)
Refill sent.

## 2016-06-19 NOTE — Telephone Encounter (Signed)
Pt contacted office for refill request on the following medications:  topiramate (TOPAMAX) 100 MG tablet.  [facility].  ZO#[phone]/WJCB#[id number]/MW

## 2016-06-19 NOTE — Telephone Encounter (Signed)
Please review-aa 

## 2017-05-04 IMAGING — CR DG ABDOMEN 1V
1 series · 1 of 1 positions shown · non-contrast
Comparison: None.

CLINICAL DATA: Difficulty urinating

EXAM:
ABDOMEN - 1 VIEW

[dg abd 1 view]
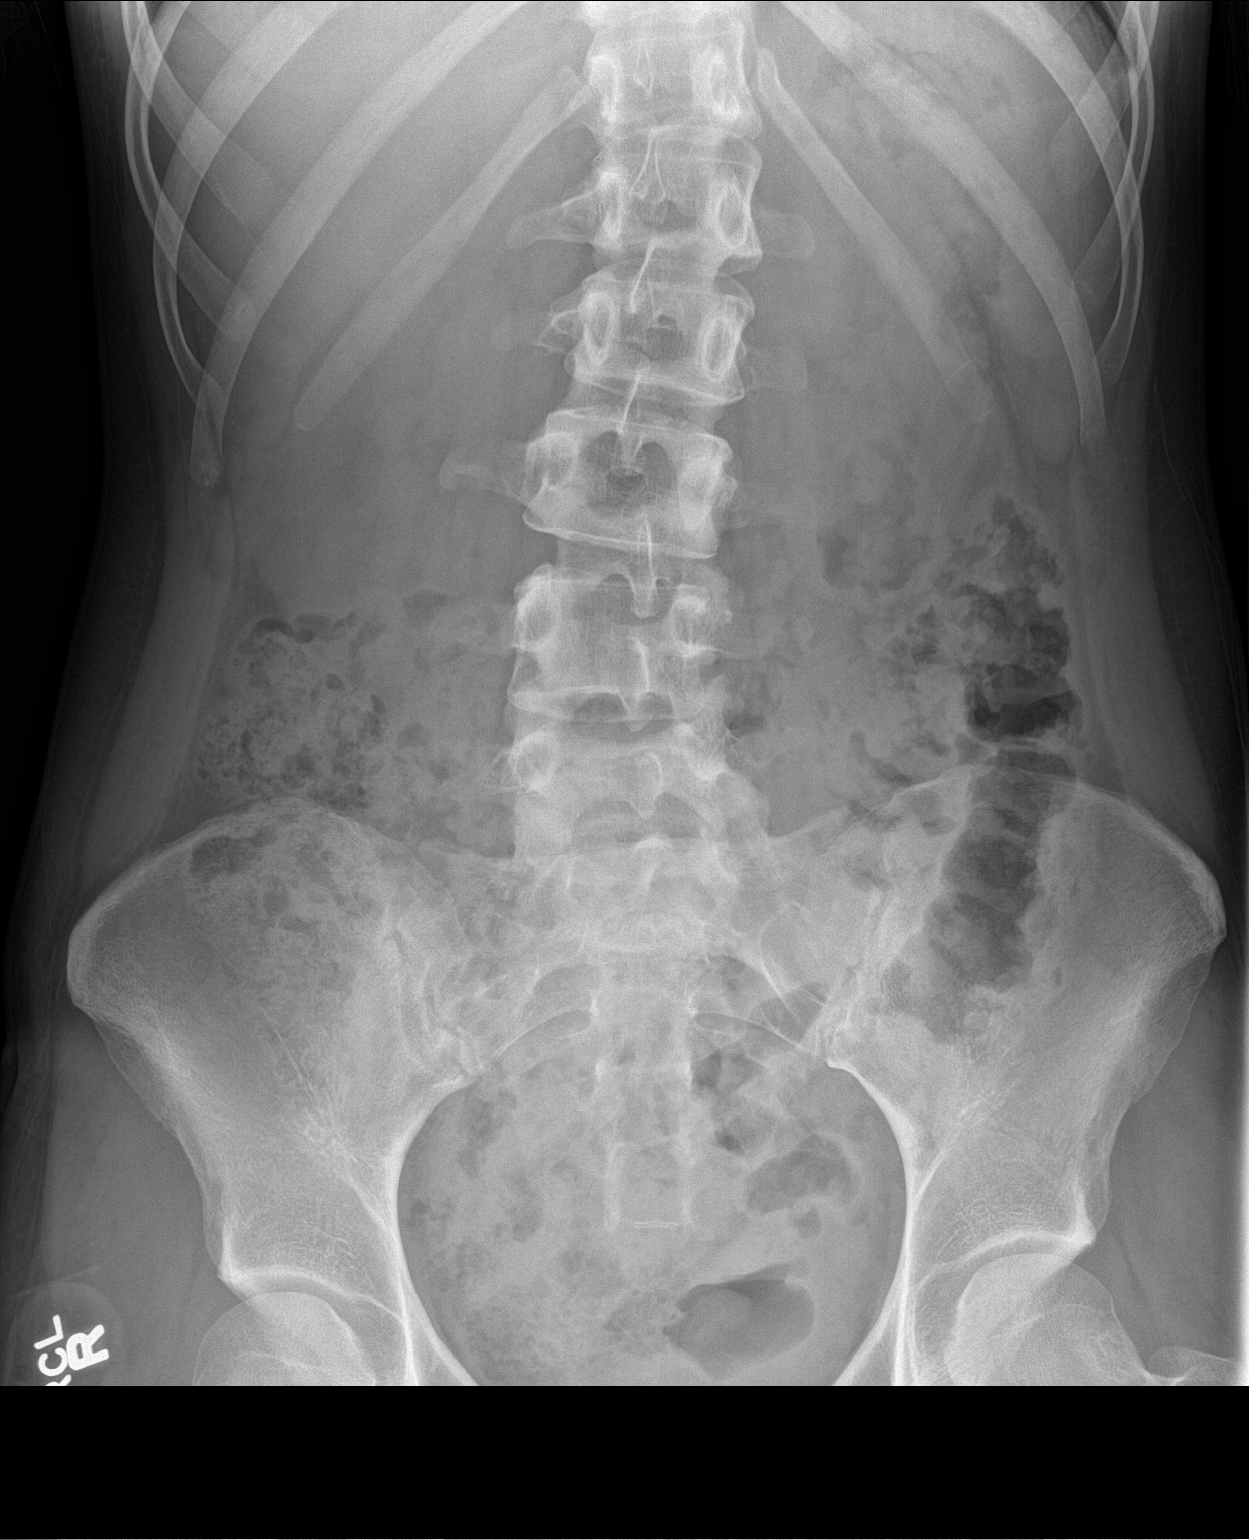

[1 of 1 positions shown; findings below may reference images not displayed]

FINDINGS: No disproportionate dilatation of bowel. No obvious free
intraperitoneal gas. Mild scoliosis of the lumbar spine.
IMPRESSION: Nonobstructive bowel gas pattern.

## 2017-11-10 IMAGING — US US RENAL
1 series · 14 of 25 positions shown · non-contrast
Comparison: None.

CLINICAL DATA: Recurrent UTI, urinary frequency

EXAM:
RENAL / URINARY TRACT ULTRASOUND COMPLETE

[Series 1: us renal · 0.19mm/px · 14 of 70 slices shown]
[im 1/70]
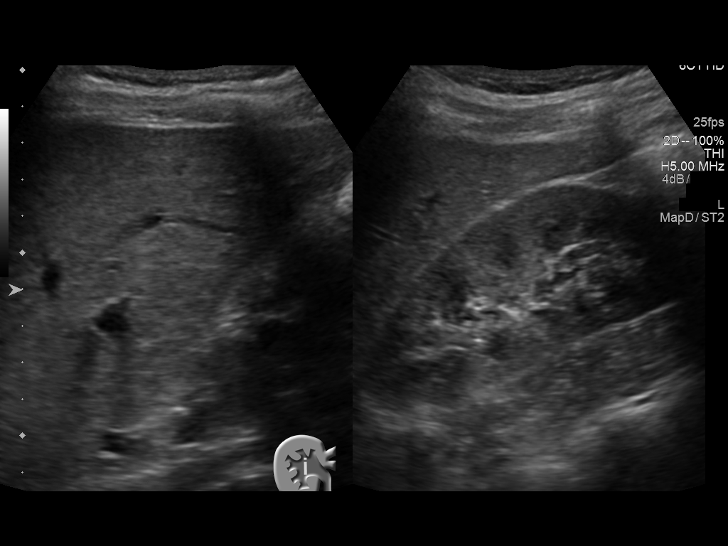
[im 6/70]
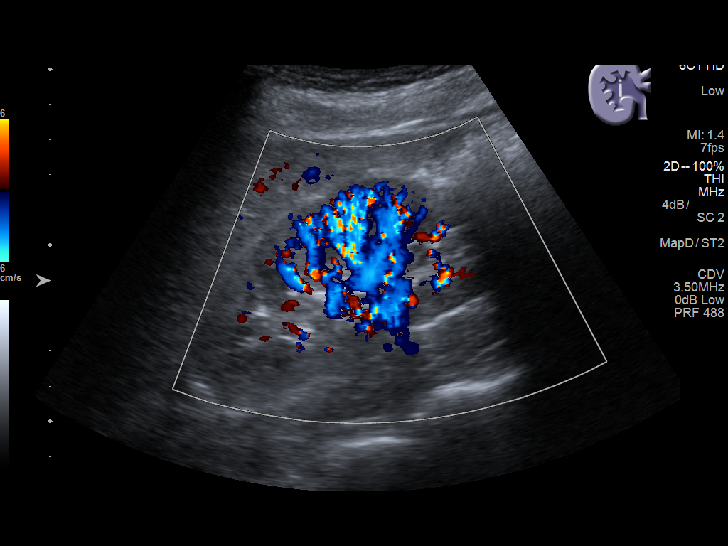
[im 12/70]
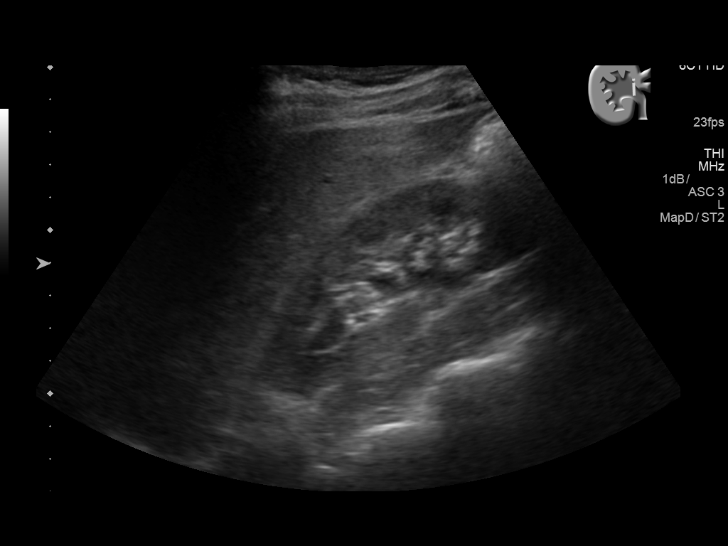
[im 18/70]
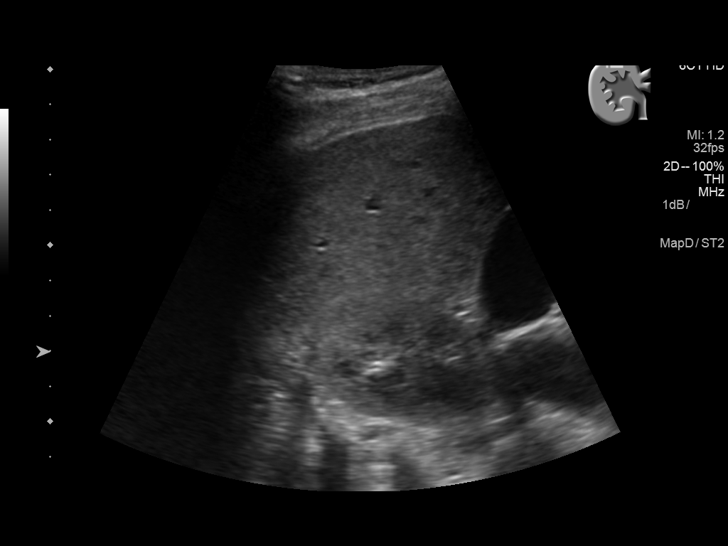
[im 24/70]
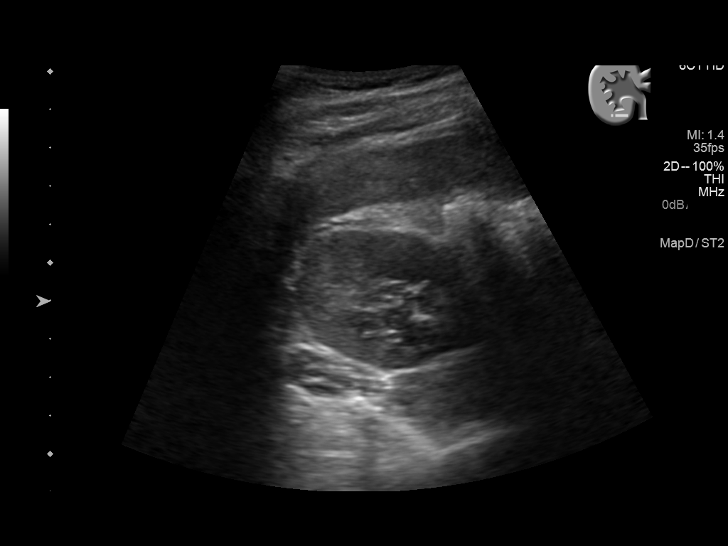
[im 26/70]
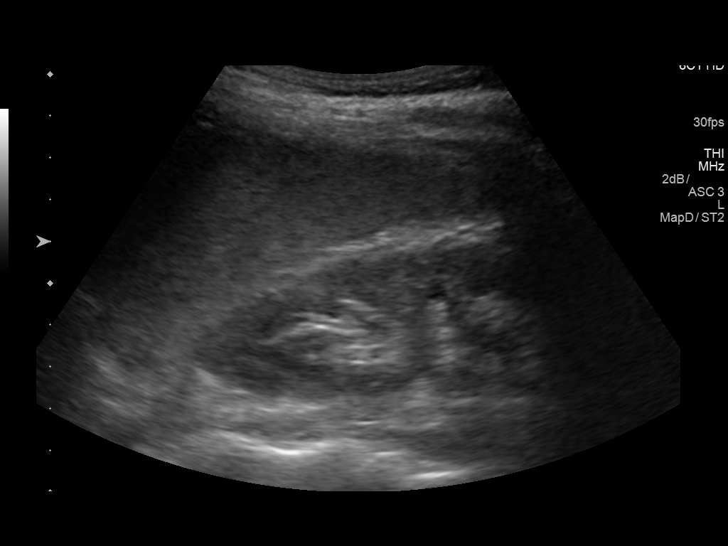
[im 32/70]
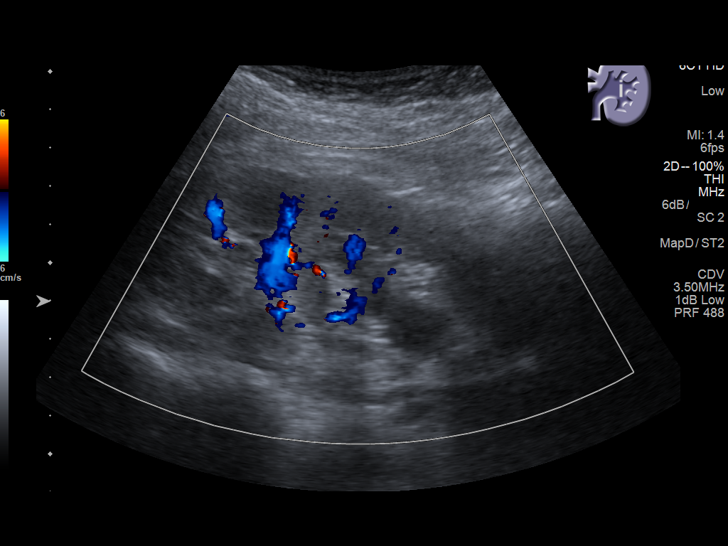
[im 38/70]
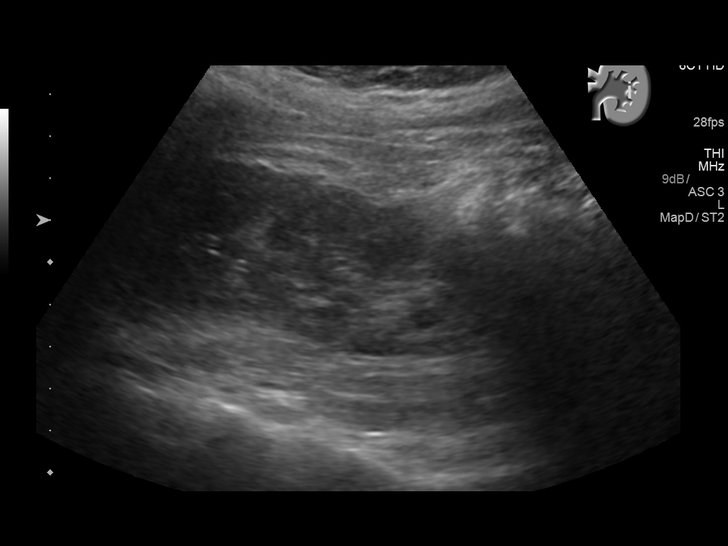
[im 44/70]
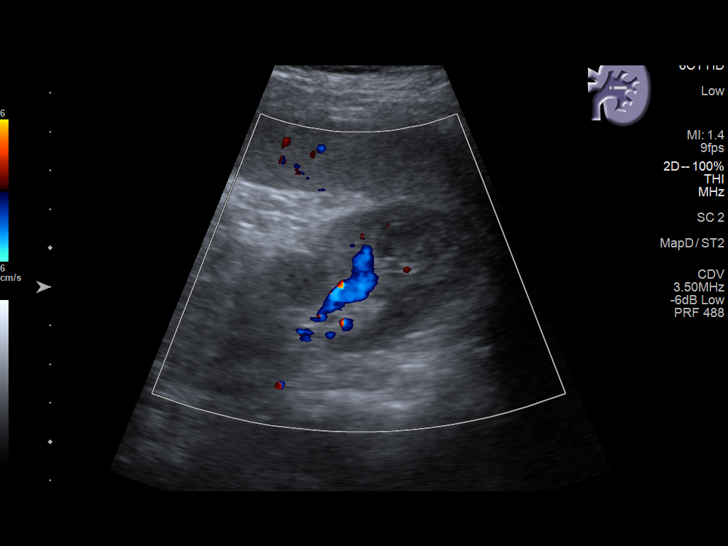
[im 47/70]
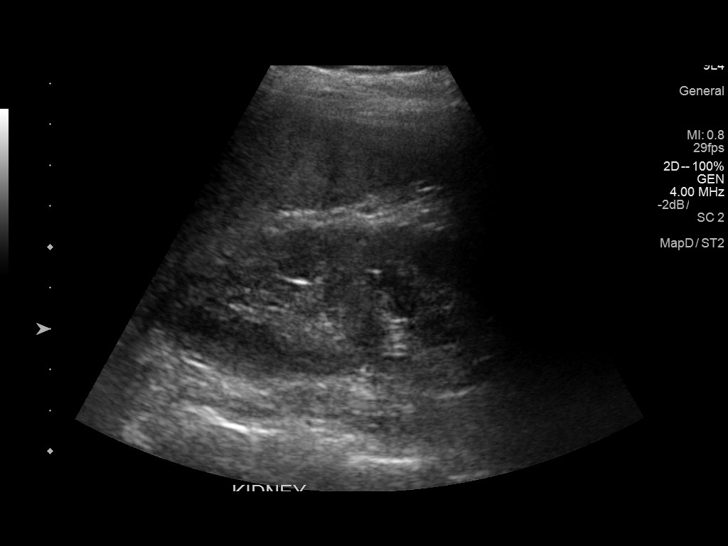
[im 52/70]
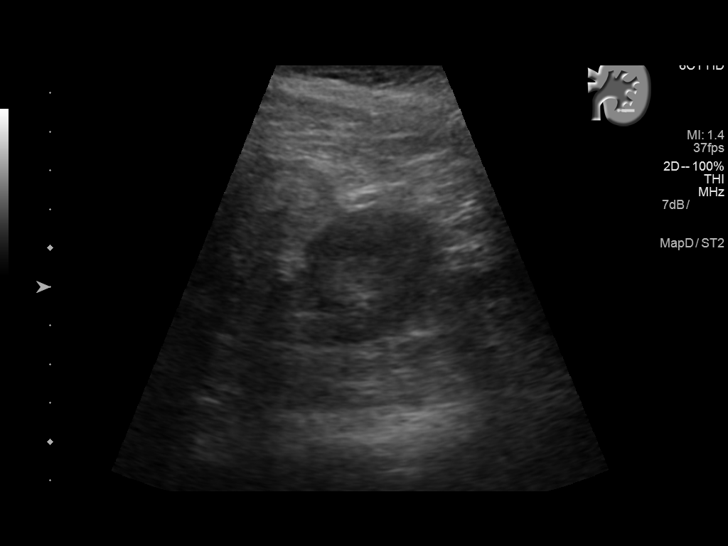
[im 58/70]
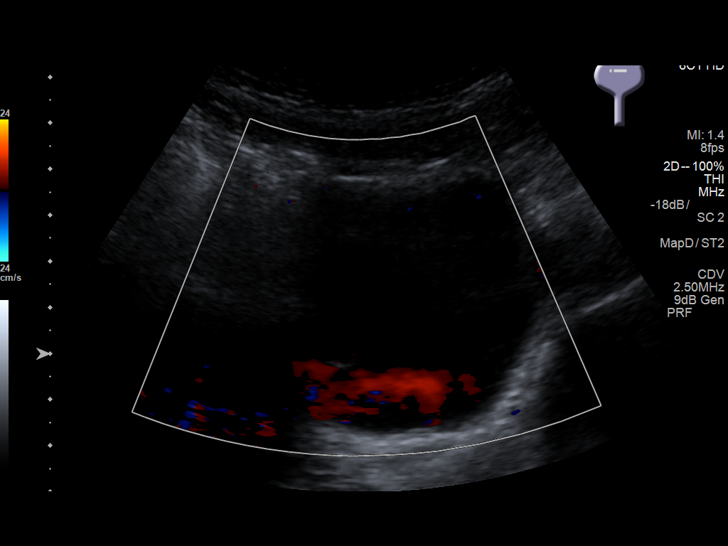
[im 64/70]
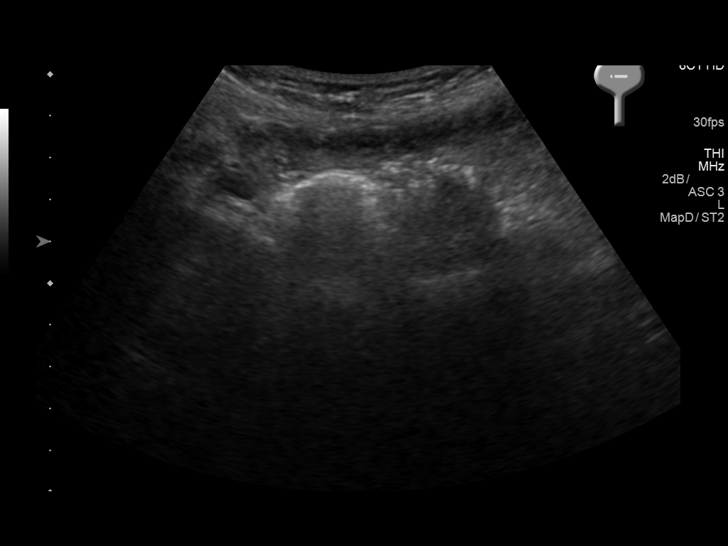
[im 70/70]
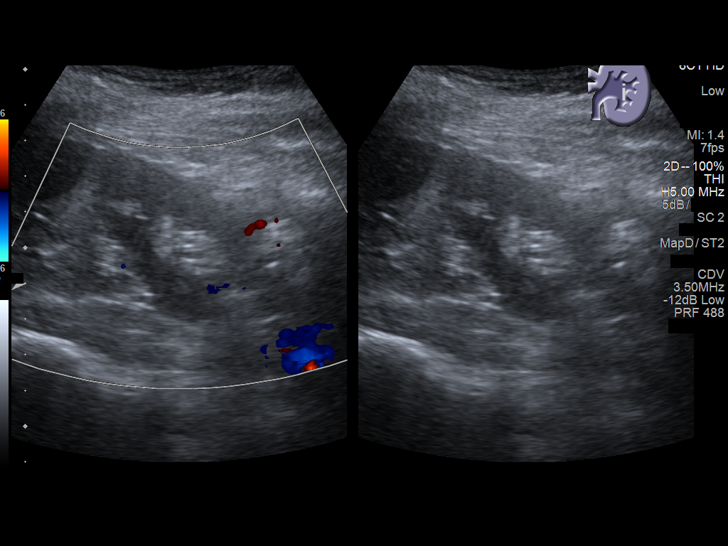

[14 of 25 positions shown; findings below may reference images not displayed]

FINDINGS: Right Kidney:

Length: 11 cm. Echogenicity within normal limits. No mass or
hydronephrosis visualized.

Left Kidney:

Length: 12 cm. Echogenicity within normal limits. No mass or
hydronephrosis visualized. Mild left pelviectasis.

Bladder:

Appears normal for degree of bladder distention. Prevoid volume 100
mL. Postvoid volume 0 mL.
IMPRESSION: Normal renal ultrasound.

## 2018-08-13 IMAGING — US US CAROTID DUPLEX BILAT
1 series · 13 of 24 positions shown · non-contrast
Comparison: No recent prior.

CLINICAL DATA: Bilateral carotid calcifications.

EXAM:
BILATERAL CAROTID DUPLEX ULTRASOUND
TECHNIQUE: Gray scale imaging, color Doppler and duplex ultrasound were
performed of bilateral carotid and vertebral arteries in the neck.

[Series 1: us carotid duplex bilat · 0.05mm/px · 13 of 64 slices shown]
[im 1/64]
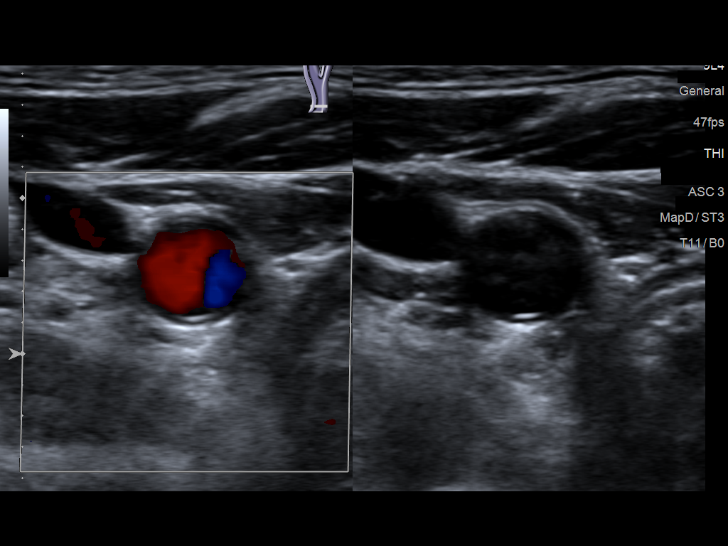
[im 6/64]
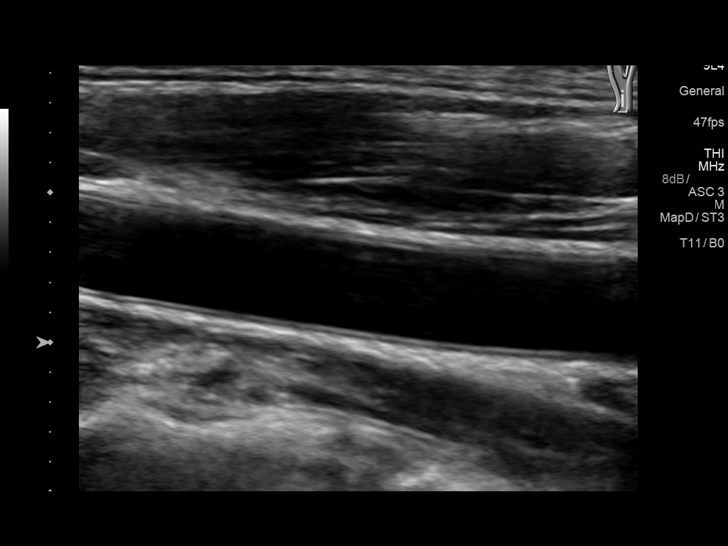
[im 11/64]
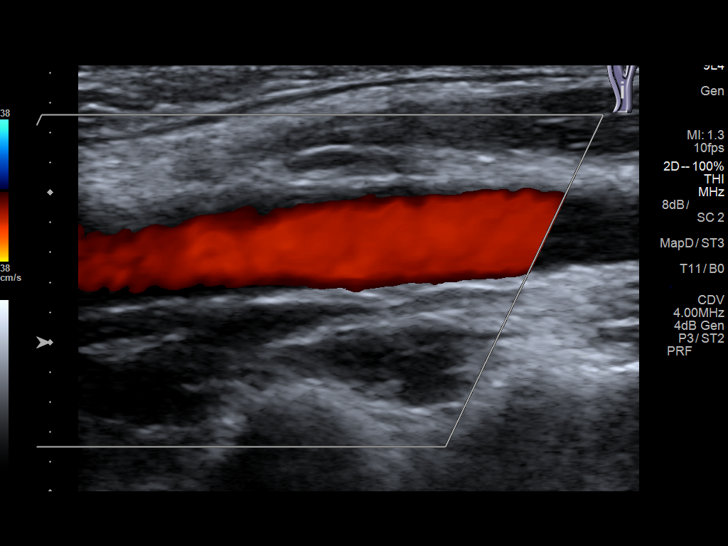
[im 17/64]
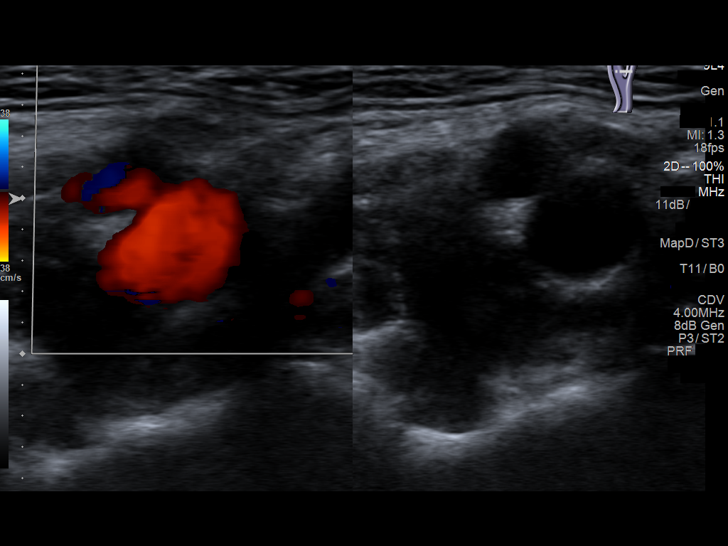
[im 22/64]
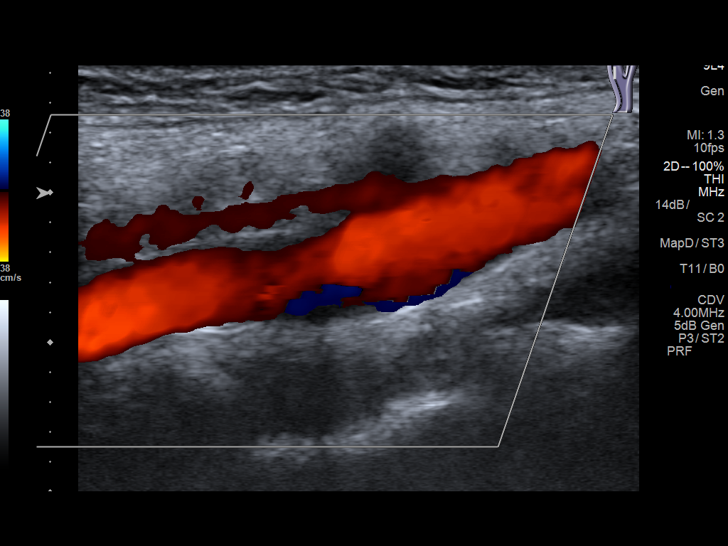
[im 28/64]
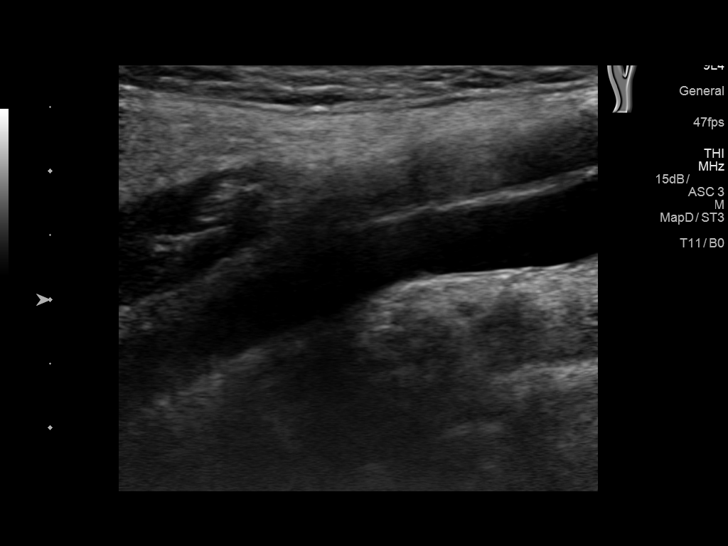
[im 33/64]
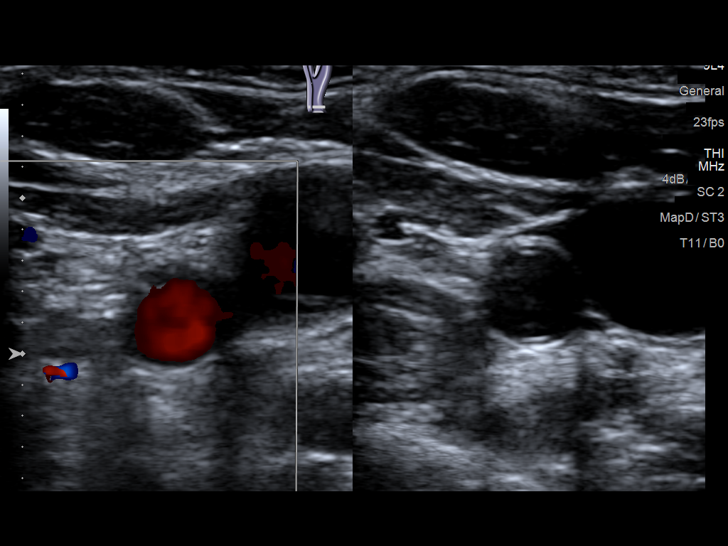
[im 36/64]
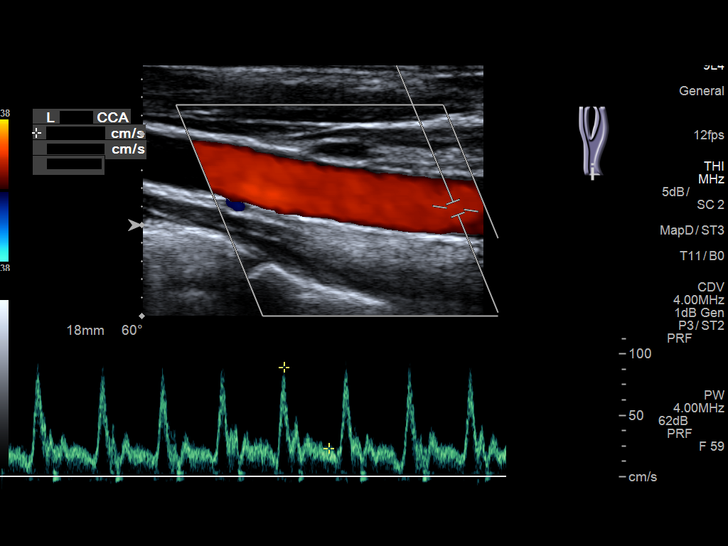
[im 42/64]
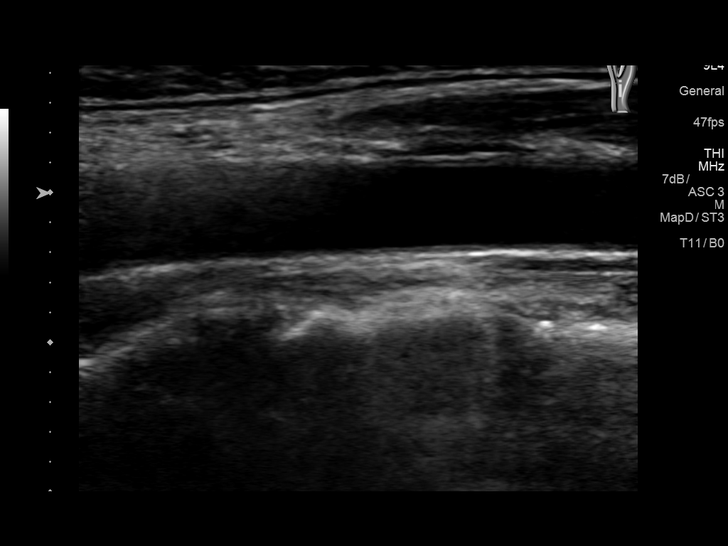
[im 47/64]
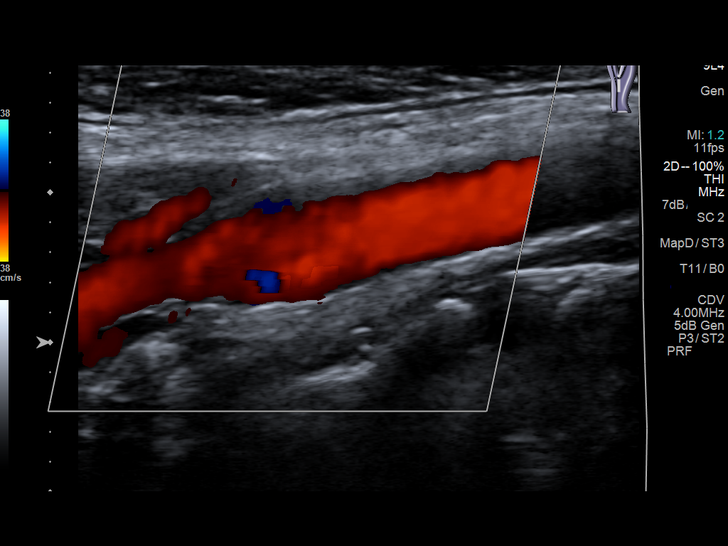
[im 53/64]
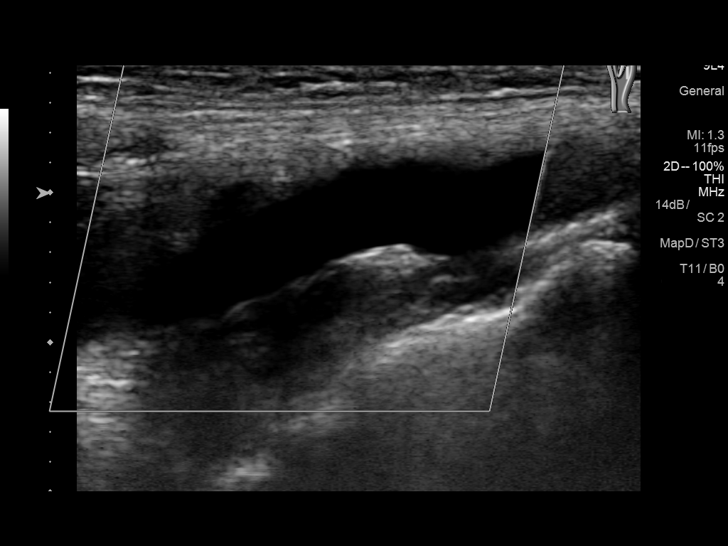
[im 58/64]
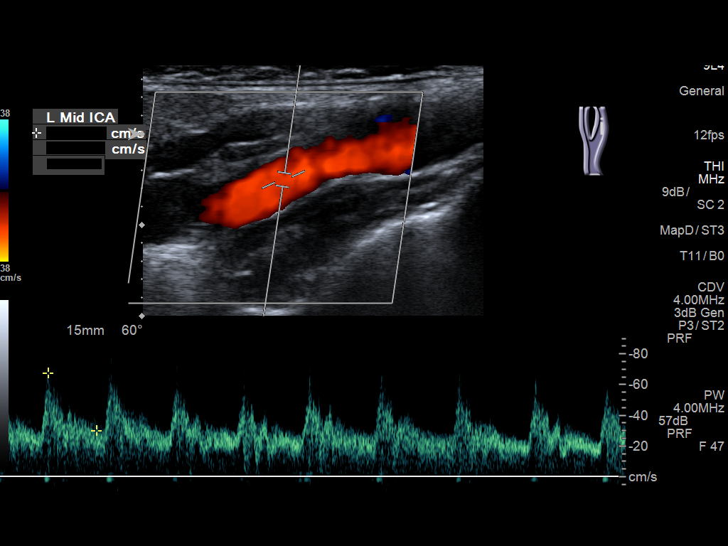
[im 64/64]
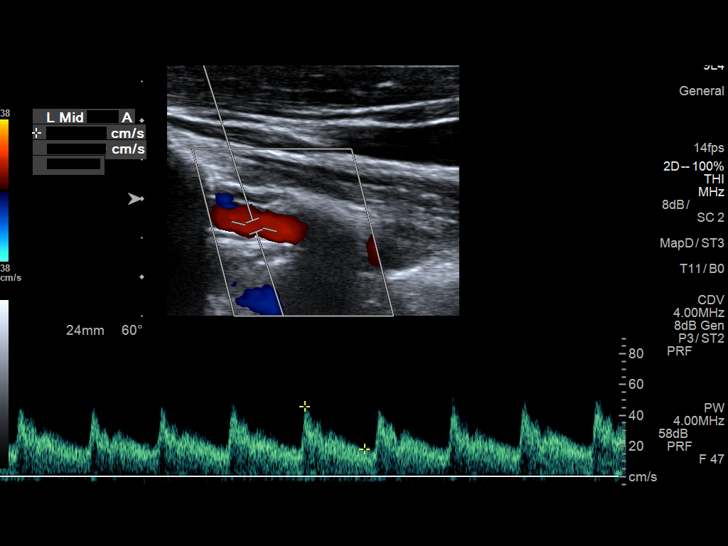

[13 of 24 positions shown; findings below may reference images not displayed]

FINDINGS: Criteria: Quantification of carotid stenosis is based on velocity
parameters that correlate the residual internal carotid diameter
with NASCET-based stenosis levels, using the diameter of the distal
internal carotid lumen as the denominator for stenosis measurement.

The following velocity measurements were obtained:

RIGHT

ICA:  91/29 cm/sec

CCA:  102/20 a cm/sec

SYSTOLIC ICA/CCA RATIO:

DIASTOLIC ICA/CCA RATIO:

ECA:  65 cm/sec

LEFT

ICA:  68/30 cm/sec

CCA:  105/28 cm/sec

SYSTOLIC ICA/CCA RATIO:

DIASTOLIC ICA/CCA RATIO:

ECA:  54 cm/sec

RIGHT CAROTID ARTERY: No significant right carotid atherosclerotic
vascular disease. No flow limiting stenosis.

RIGHT VERTEBRAL ARTERY:  Patent with antegrade flow.

LEFT CAROTID ARTERY: No significant left carotid atherosclerotic
vascular disease. No flow limiting stenosis.

LEFT VERTEBRAL ARTERY:  Patent with antegrade flow.
IMPRESSION: 1. No significant carotid atherosclerotic vascular disease. The
carotids are widely patent bilaterally.

2. Vertebral arteries are patent antegrade flow.
